# Patient Record
Sex: Male | Born: 1970 | Race: White | Hispanic: No | Marital: Single | Smoking: Current every day smoker
Health system: Southern US, Community
[De-identification: ages and names within clinical notes are randomized; demographics above are authoritative.]

## PROBLEM LIST (undated history)

## (undated) DIAGNOSIS — F319 Bipolar disorder, unspecified: Secondary | ICD-10-CM

## (undated) DIAGNOSIS — F209 Schizophrenia, unspecified: Secondary | ICD-10-CM

## (undated) DIAGNOSIS — R569 Unspecified convulsions: Secondary | ICD-10-CM

## (undated) DIAGNOSIS — I10 Essential (primary) hypertension: Secondary | ICD-10-CM

## (undated) DIAGNOSIS — E119 Type 2 diabetes mellitus without complications: Secondary | ICD-10-CM

## (undated) HISTORY — PX: APPENDECTOMY: SHX54

---

## 2010-08-22 ENCOUNTER — Emergency Department: Payer: Self-pay | Admitting: Emergency Medicine

## 2010-10-17 ENCOUNTER — Ambulatory Visit: Payer: Self-pay | Admitting: Internal Medicine

## 2010-11-07 ENCOUNTER — Inpatient Hospital Stay: Payer: Self-pay | Admitting: Psychiatry

## 2010-11-21 ENCOUNTER — Emergency Department: Payer: Self-pay | Admitting: Emergency Medicine

## 2011-02-27 ENCOUNTER — Emergency Department: Payer: Self-pay | Admitting: Unknown Physician Specialty

## 2011-04-26 ENCOUNTER — Emergency Department: Payer: Self-pay | Admitting: Emergency Medicine

## 2013-12-11 ENCOUNTER — Emergency Department: Payer: Self-pay | Admitting: Emergency Medicine

## 2013-12-11 LAB — ETHANOL: Ethanol %: <0.003 % (ref 0.000–0.080)

## 2013-12-11 LAB — URINALYSIS, COMPLETE
BLOOD: NEGATIVE
Bacteria: NONE SEEN
Bilirubin,UR: NEGATIVE
Glucose,UR: NEGATIVE mg/dL (ref 0–75)
Ketone: NEGATIVE
Leukocyte Esterase: NEGATIVE
NITRITE: NEGATIVE
PROTEIN: NEGATIVE
Ph: 7 (ref 4.5–8.0)
RBC,UR: 1 /HPF (ref 0–5)
Specific Gravity: 1.011 (ref 1.003–1.030)
Squamous Epithelial: NONE SEEN
WBC UR: NONE SEEN /HPF (ref 0–5)

## 2013-12-11 LAB — COMPREHENSIVE METABOLIC PANEL
ALT: 18 U/L (ref 12–78)
ANION GAP: 6 — AB (ref 7–16)
Albumin: 3.4 g/dL (ref 3.4–5.0)
Alkaline Phosphatase: 30 U/L — ABNORMAL LOW
BILIRUBIN TOTAL: 0.2 mg/dL (ref 0.2–1.0)
BUN: 9 mg/dL (ref 7–18)
Calcium, Total: 8.8 mg/dL (ref 8.5–10.1)
Chloride: 99 mmol/L (ref 98–107)
Co2: 28 mmol/L (ref 21–32)
Creatinine: 0.82 mg/dL (ref 0.60–1.30)
EGFR (Non-African Amer.): >60
Glucose: 108 mg/dL — ABNORMAL HIGH (ref 65–99)
OSMOLALITY: 266 (ref 275–301)
Potassium: 3.8 mmol/L (ref 3.5–5.1)
SGOT(AST): 18 U/L (ref 15–37)
SODIUM: 133 mmol/L — AB (ref 136–145)
Total Protein: 6.8 g/dL (ref 6.4–8.2)

## 2013-12-11 LAB — DRUG SCREEN, URINE

## 2013-12-11 LAB — TSH: THYROID STIMULATING HORM: 1.67 u[IU]/mL

## 2013-12-11 LAB — CBC
HCT: 37.3 % — AB (ref 40.0–52.0)
HGB: 13.2 g/dL (ref 13.0–18.0)
MCH: 33.1 pg (ref 26.0–34.0)
MCHC: 35.5 g/dL (ref 32.0–36.0)
MCV: 93 fL (ref 80–100)
Platelet: 153 10*3/uL (ref 150–440)
RBC: 3.99 10*6/uL — ABNORMAL LOW (ref 4.40–5.90)
RDW: 13.4 % (ref 11.5–14.5)
WBC: 6 10*3/uL (ref 3.8–10.6)

## 2013-12-11 LAB — SALICYLATE LEVEL

## 2013-12-11 LAB — ACETAMINOPHEN LEVEL: Acetaminophen: <2 ug/mL

## 2014-03-29 ENCOUNTER — Emergency Department (HOSPITAL_COMMUNITY): Payer: Medicaid Other

## 2014-03-29 ENCOUNTER — Encounter (HOSPITAL_COMMUNITY): Payer: Self-pay | Admitting: Emergency Medicine

## 2014-03-29 ENCOUNTER — Emergency Department (HOSPITAL_COMMUNITY)
Admission: EM | Admit: 2014-03-29 | Discharge: 2014-03-29 | Disposition: A | Discharge status: Home / Self Care | Payer: Medicaid Other | Attending: Emergency Medicine | Admitting: Emergency Medicine

## 2014-03-29 DIAGNOSIS — R079 Chest pain, unspecified: Secondary | ICD-10-CM

## 2014-03-29 DIAGNOSIS — R072 Precordial pain: Secondary | ICD-10-CM | POA: Insufficient documentation

## 2014-03-29 DIAGNOSIS — R109 Unspecified abdominal pain: Secondary | ICD-10-CM | POA: Insufficient documentation

## 2014-03-29 DIAGNOSIS — Z8669 Personal history of other diseases of the nervous system and sense organs: Secondary | ICD-10-CM | POA: Insufficient documentation

## 2014-03-29 DIAGNOSIS — F17200 Nicotine dependence, unspecified, uncomplicated: Secondary | ICD-10-CM | POA: Insufficient documentation

## 2014-03-29 DIAGNOSIS — E119 Type 2 diabetes mellitus without complications: Secondary | ICD-10-CM | POA: Insufficient documentation

## 2014-03-29 DIAGNOSIS — I10 Essential (primary) hypertension: Secondary | ICD-10-CM | POA: Insufficient documentation

## 2014-03-29 HISTORY — DX: Unspecified convulsions: R56.9

## 2014-03-29 HISTORY — DX: Type 2 diabetes mellitus without complications: E11.9

## 2014-03-29 HISTORY — DX: Essential (primary) hypertension: I10

## 2014-03-29 LAB — CBC WITH DIFFERENTIAL/PLATELET
BASOS ABS: 0 10*3/uL (ref 0.0–0.1)
BASOS PCT: 0 % (ref 0–1)
EOS ABS: 0.1 10*3/uL (ref 0.0–0.7)
EOS PCT: 2 % (ref 0–5)
HCT: 39.3 % (ref 39.0–52.0)
Hemoglobin: 13.9 g/dL (ref 13.0–17.0)
Lymphocytes Relative: 46 % (ref 12–46)
Lymphs Abs: 2.2 10*3/uL (ref 0.7–4.0)
MCH: 32.3 pg (ref 26.0–34.0)
MCHC: 35.4 g/dL (ref 30.0–36.0)
MCV: 91.4 fL (ref 78.0–100.0)
Monocytes Absolute: 0.5 10*3/uL (ref 0.1–1.0)
Monocytes Relative: 10 % (ref 3–12)
Neutro Abs: 2.1 10*3/uL (ref 1.7–7.7)
Neutrophils Relative %: 42 % — ABNORMAL LOW (ref 43–77)
PLATELETS: 187 10*3/uL (ref 150–400)
RBC: 4.3 MIL/uL (ref 4.22–5.81)
RDW: 13.2 % (ref 11.5–15.5)
WBC: 4.9 10*3/uL (ref 4.0–10.5)

## 2014-03-29 LAB — BASIC METABOLIC PANEL
BUN: 12 mg/dL (ref 6–23)
CALCIUM: 9.2 mg/dL (ref 8.4–10.5)
CO2: 29 mEq/L (ref 19–32)
Chloride: 98 mEq/L (ref 96–112)
Creatinine, Ser: 0.89 mg/dL (ref 0.50–1.35)
GFR calc Af Amer: >90 mL/min (ref >90)
Glucose, Bld: 124 mg/dL — ABNORMAL HIGH (ref 70–99)
Potassium: 4 mEq/L (ref 3.7–5.3)
SODIUM: 138 meq/L (ref 137–147)

## 2014-03-29 LAB — TROPONIN I: Troponin I: <0.3 ng/mL (ref <0.30)

## 2014-03-29 MED ORDER — ONDANSETRON 8 MG PO TBDP
8.0000 mg | ORAL_TABLET | Freq: Once | ORAL | Status: AC
  Administered 2014-03-29: 8 mg via ORAL
  Filled 2014-03-29: qty 1

## 2014-03-29 MED ORDER — PROMETHAZINE HCL 25 MG PO TABS: 25.0000 mg | ORAL_TABLET | Freq: Four times a day (QID) | ORAL | Status: DC | PRN

## 2014-03-29 MED ORDER — GI COCKTAIL ~~LOC~~
30.0000 mL | Freq: Once | ORAL | Status: AC
  Administered 2014-03-29: 30 mL via ORAL
  Filled 2014-03-29: qty 30

## 2014-03-29 NOTE — ED Notes (Signed)
Pt called in all waiting areas, unable to locate

## 2014-03-29 NOTE — Discharge Instructions (Signed)
Tests here were normal. Medication for nausea. Followup your primary care Dr.

## 2014-03-29 NOTE — ED Notes (Signed)
Pt c/o mid sternal cp and n/v after eating a sandwich this afternoon.

## 2014-03-29 NOTE — ED Notes (Signed)
Spoke with caregiver at [facility] and informed them of pts discharged. She states she would call him a ride. Pt placed in wheelchair in the waiting room.

## 2014-03-29 NOTE — ED Provider Notes (Signed)
CSN: [id number]     Arrival date & time 03/29/14  1818 History   First MD Initiated Contact with Patient 03/29/14 1922     Chief Complaint  Patient presents with  . Chest Pain     (Consider location/radiation/quality/duration/timing/severity/associated sxs/prior Treatment) HPI.... patient complains of sharp sternal chest pain after eating  sandwich this afternoon. No dyspnea, diaphoresis, nausea. He feels better now. Severity is mild. No other symptoms. No radiation of pain.  Past Medical History  Diagnosis Date  . Hypertension   . Diabetes mellitus without complication   . Seizures    Past Surgical History  Procedure Laterality Date  . Appendectomy     No family history on file. History  Substance Use Topics  . Smoking status: Current Every Day Smoker  . Smokeless tobacco: Not on file  . Alcohol Use: No    Review of Systems  All other systems reviewed and are negative.     Allergies  Review of patient's allergies indicates no known allergies.  Home Medications   Prior to Admission medications   Medication Sig Start Date End Date Taking? Authorizing Provider  promethazine (PHENERGAN) 25 MG tablet Take 1 tablet (25 mg total) by mouth every 6 (six) hours as needed. 03/29/14   Donnetta HutchingBrian Jerricka Carvey, MD   BP 110/64  Pulse 59  Temp(Src) 97.5 F (36.4 C)  Resp 18  Ht 5' 10.5" (1.791 m)  Wt 200 lb (90.719 kg)  BMI 28.28 kg/m2  SpO2 99% Physical Exam  Nursing note and vitals reviewed. Constitutional: He is oriented to person, place, and time. He appears well-developed and well-nourished.  HENT:  Head: Normocephalic and atraumatic.  Eyes: Conjunctivae and EOM are normal. Pupils are equal, round, and reactive to light.  Neck: Normal range of motion. Neck supple.  Cardiovascular: Normal rate, regular rhythm and normal heart sounds.   Pulmonary/Chest: Effort normal and breath sounds normal.  Abdominal: Soft. Bowel sounds are normal.  Musculoskeletal: Normal range of motion.   Neurological: He is alert and oriented to person, place, and time.  Skin: Skin is warm and dry.  Psychiatric: He has a normal mood and affect. His behavior is normal.    ED Course  Procedures (including critical care time) Labs Review Labs Reviewed  CBC WITH DIFFERENTIAL - Abnormal; Notable for the following:    Neutrophils Relative % 42 (*)    All other components within normal limits  BASIC METABOLIC PANEL - Abnormal; Notable for the following:    Glucose, Bld 124 (*)    All other components within normal limits  TROPONIN I    Imaging Review Dg Chest 2 View  03/29/2014   CLINICAL DATA:  Chest pain after eating  EXAM: CHEST  2 VIEW  COMPARISON:  None.  FINDINGS: There is no focal parenchymal opacity, pleural effusion, or pneumothorax. The heart and mediastinal contours are unremarkable.  There is mild thoracic spine spondylosis.  IMPRESSION: No active cardiopulmonary disease.   Electronically Signed   By: Elige KoHetal  Patel   On: 03/29/2014 19:09     EKG Interpretation None      MDM   Final diagnoses:  Chest pain  Abdominal pain    Patient looks well. Screening EKG, hemoglobin, troponin, chest x-ray all normal. Discharge medications Phenergan 25 mg    Donnetta HutchingBrian Tanveer Brammer, MD 03/29/14 2120

## 2015-08-31 ENCOUNTER — Encounter: Payer: Self-pay | Admitting: Medical Oncology

## 2015-08-31 ENCOUNTER — Emergency Department
Admission: EM | Admit: 2015-08-31 | Discharge: 2015-09-01 | Disposition: A | Discharge status: Home / Self Care | Payer: Medicaid Other | Attending: Emergency Medicine | Admitting: Emergency Medicine

## 2015-08-31 DIAGNOSIS — Z720 Tobacco use: Secondary | ICD-10-CM | POA: Diagnosis not present

## 2015-08-31 DIAGNOSIS — F1310 Sedative, hypnotic or anxiolytic abuse, uncomplicated: Secondary | ICD-10-CM | POA: Insufficient documentation

## 2015-08-31 DIAGNOSIS — F919 Conduct disorder, unspecified: Secondary | ICD-10-CM | POA: Insufficient documentation

## 2015-08-31 DIAGNOSIS — Z008 Encounter for other general examination: Secondary | ICD-10-CM | POA: Diagnosis present

## 2015-08-31 DIAGNOSIS — I10 Essential (primary) hypertension: Secondary | ICD-10-CM | POA: Diagnosis not present

## 2015-08-31 DIAGNOSIS — F209 Schizophrenia, unspecified: Secondary | ICD-10-CM

## 2015-08-31 DIAGNOSIS — E119 Type 2 diabetes mellitus without complications: Secondary | ICD-10-CM | POA: Diagnosis not present

## 2015-08-31 DIAGNOSIS — F200 Paranoid schizophrenia: Secondary | ICD-10-CM | POA: Diagnosis not present

## 2015-08-31 DIAGNOSIS — R4689 Other symptoms and signs involving appearance and behavior: Secondary | ICD-10-CM

## 2015-08-31 DIAGNOSIS — G40909 Epilepsy, unspecified, not intractable, without status epilepticus: Secondary | ICD-10-CM

## 2015-08-31 LAB — CBC
HCT: 39.4 % — ABNORMAL LOW (ref 40.0–52.0)
Hemoglobin: 13.8 g/dL (ref 13.0–18.0)
MCH: 33.4 pg (ref 26.0–34.0)
MCHC: 35.1 g/dL (ref 32.0–36.0)
MCV: 95.2 fL (ref 80.0–100.0)
PLATELETS: 147 10*3/uL — AB (ref 150–440)
RBC: 4.14 MIL/uL — AB (ref 4.40–5.90)
RDW: 13.9 % (ref 11.5–14.5)
WBC: 3.4 10*3/uL — AB (ref 3.8–10.6)

## 2015-08-31 LAB — COMPREHENSIVE METABOLIC PANEL
ALT: 16 U/L — ABNORMAL LOW (ref 17–63)
AST: 25 U/L (ref 15–41)
Albumin: 3.8 g/dL (ref 3.5–5.0)
Alkaline Phosphatase: 31 U/L — ABNORMAL LOW (ref 38–126)
Anion gap: 8 (ref 5–15)
BUN: 12 mg/dL (ref 6–20)
CHLORIDE: 101 mmol/L (ref 101–111)
CO2: 27 mmol/L (ref 22–32)
Calcium: 9.5 mg/dL (ref 8.9–10.3)
Creatinine, Ser: 0.67 mg/dL (ref 0.61–1.24)
GFR calc Af Amer: >60 mL/min (ref >60)
GFR calc non Af Amer: >60 mL/min (ref >60)
Glucose, Bld: 108 mg/dL — ABNORMAL HIGH (ref 65–99)
POTASSIUM: 4.3 mmol/L (ref 3.5–5.1)
Sodium: 136 mmol/L (ref 135–145)
TOTAL PROTEIN: 7 g/dL (ref 6.5–8.1)
Total Bilirubin: 0.4 mg/dL (ref 0.3–1.2)

## 2015-08-31 LAB — ACETAMINOPHEN LEVEL

## 2015-08-31 LAB — ETHANOL: Alcohol, Ethyl (B): <5 mg/dL (ref <5)

## 2015-08-31 LAB — SALICYLATE LEVEL
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
## 2015-08-31 NOTE — ED Notes (Signed)
BEHAVIORAL HEALTH ROUNDING Patient sleeping: Yes.   Patient alert and oriented: not applicable Behavior appropriate: Yes.  ; If no, describe: Patient is sleeping Nutrition and fluids offered: No Toileting and hygiene offered: Yes  Sitter present: Behavioral tech rounding every 15 minutes for safety Law enforcement present: Yes  and [employer] 
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
## 2015-08-31 NOTE — ED Notes (Signed)
BEHAVIORAL HEALTH ROUNDING Patient sleeping: No. Patient alert and oriented: Patient is oriented to self  Behavior appropriate: Yes.  ; If no, describe: Tearful at times, but will stop easily. Nutrition and fluids offered: Yes  Toileting and hygiene offered: Yes  Sitter present: Behavioral tech rounding every 15 minutes for safety. Law enforcement present: Yes  and [employer], television/film set.

## 2015-08-31 NOTE — ED Notes (Signed)

## 2015-08-31 NOTE — ED Notes (Signed)
ED BHU PLACEMENT JUSTIFICATION Is the patient under IVC or is there intent for IVC: No. Is the patient medically cleared: No. Is there vacancy in the ED BHU: No. Is the population mix appropriate for patient: Yes.   Is the patient awaiting placement in inpatient or outpatient setting: No. Has the patient had a psychiatric consult: No. Survey of unit performed for contraband, proper placement and condition of furniture, tampering with fixtures in bathroom, shower, and each patient room: Yes.  ; Findings:  APPEARANCE/BEHAVIOR cooperative and tearful at times, but easily redirected. NEURO ASSESSMENT Orientation: time, place and person Hallucinations: Yes.  Auditory Hallucinations Speech: Rate:slow speech, a bit slurred at times. Gait: normal RESPIRATORY ASSESSMENT Normal expansion.  Clear to auscultation.  No rales, rhonchi, or wheezing. CARDIOVASCULAR ASSESSMENT regular rate and rhythm, S1, S2 normal, no murmur, click, rub or gallop GASTROINTESTINAL ASSESSMENT soft, nontender, BS WNL, no r/g EXTREMITIES normal strength, tone, and muscle mass PLAN OF CARE Provide calm/safe environment. Vital signs assessed twice daily. ED BHU Assessment once each 12-hour shift. Collaborate with intake RN daily or as condition indicates. Assure the ED provider has rounded once each shift. Provide and encourage hygiene. Provide redirection as needed. Assess for escalating behavior; address immediately and inform ED provider.  Assess family dynamic and appropriateness for visitation as needed: No.; If necessary, describe findings:  Educate the patient/family about BHU procedures/visitation: Yes.  ; If necessary, describe findings:

## 2015-08-31 NOTE — ED Notes (Signed)
Patient is sleeping on stretcher. NAD. 
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
## 2015-09-01 DIAGNOSIS — F209 Schizophrenia, unspecified: Secondary | ICD-10-CM

## 2015-09-01 DIAGNOSIS — G40909 Epilepsy, unspecified, not intractable, without status epilepticus: Secondary | ICD-10-CM

## 2015-09-01 DIAGNOSIS — I10 Essential (primary) hypertension: Secondary | ICD-10-CM

## 2015-09-01 DIAGNOSIS — F200 Paranoid schizophrenia: Secondary | ICD-10-CM | POA: Diagnosis not present

## 2015-09-01 LAB — URINE DRUG SCREEN, QUALITATIVE (ARMC ONLY)
Amphetamines, Ur Screen: NOT DETECTED
BARBITURATES, UR SCREEN: NOT DETECTED
Benzodiazepine, Ur Scrn: POSITIVE — AB
Cannabinoid 50 Ng, Ur ~~LOC~~: NOT DETECTED
Cocaine Metabolite,Ur ~~LOC~~: NOT DETECTED
MDMA (ECSTASY) UR SCREEN: NOT DETECTED
METHADONE SCREEN, URINE: NOT DETECTED
OPIATE, UR SCREEN: NOT DETECTED
Phencyclidine (PCP) Ur S: NOT DETECTED
Tricyclic, Ur Screen: NOT DETECTED

## 2015-09-01 MED ORDER — CLONAZEPAM 0.5 MG PO TABS: 0.5000 mg | ORAL_TABLET | Freq: Every day | ORAL | Status: DC | PRN

## 2015-09-01 MED ORDER — LACTULOSE 10 GM/15ML PO SOLN
10.0000 g | Freq: Once | ORAL | Status: AC
  Administered 2015-09-01: 10 g via ORAL
  Filled 2015-09-01: qty 15

## 2015-09-01 MED ORDER — PROPRANOLOL HCL 20 MG PO TABS
20.0000 mg | ORAL_TABLET | Freq: Three times a day (TID) | ORAL | Status: DC
  Filled 2015-09-01: qty 1

## 2015-09-01 MED ORDER — METFORMIN HCL 500 MG PO TABS
500.0000 mg | ORAL_TABLET | Freq: Two times a day (BID) | ORAL | Status: DC
  Administered 2015-09-01: 500 mg via ORAL
  Filled 2015-09-01: qty 1

## 2015-09-01 MED ORDER — PANTOPRAZOLE SODIUM 40 MG PO TBEC
40.0000 mg | DELAYED_RELEASE_TABLET | Freq: Two times a day (BID) | ORAL | Status: DC
  Administered 2015-09-01: 40 mg via ORAL
  Filled 2015-09-01: qty 1

## 2015-09-01 MED ORDER — METHOCARBAMOL 500 MG PO TABS
500.0000 mg | ORAL_TABLET | Freq: Three times a day (TID) | ORAL | Status: DC
  Filled 2015-09-01: qty 1

## 2015-09-01 MED ORDER — CITALOPRAM HYDROBROMIDE 20 MG PO TABS
40.0000 mg | ORAL_TABLET | Freq: Every day | ORAL | Status: DC
  Administered 2015-09-01: 40 mg via ORAL
  Filled 2015-09-01: qty 2

## 2015-09-01 MED ORDER — PRAZOSIN HCL 1 MG PO CAPS
1.0000 mg | ORAL_CAPSULE | Freq: Two times a day (BID) | ORAL | Status: DC
  Filled 2015-09-01: qty 1

## 2015-09-01 MED ORDER — CLONAZEPAM 0.5 MG PO TABS
0.5000 mg | ORAL_TABLET | Freq: Three times a day (TID) | ORAL | Status: DC
  Administered 2015-09-01: 0.5 mg via ORAL
  Filled 2015-09-01: qty 1

## 2015-09-01 MED ORDER — PALIPERIDONE ER 6 MG PO TB24
12.0000 mg | ORAL_TABLET | Freq: Every day | ORAL | Status: DC
  Filled 2015-09-01: qty 2

## 2015-09-01 MED ORDER — BENZTROPINE MESYLATE 1 MG PO TABS
1.0000 mg | ORAL_TABLET | Freq: Two times a day (BID) | ORAL | Status: DC
  Administered 2015-09-01: 1 mg via ORAL
  Filled 2015-09-01: qty 1

## 2015-09-01 MED ORDER — DONEPEZIL HCL 5 MG PO TABS
5.0000 mg | ORAL_TABLET | Freq: Every day | ORAL | Status: DC
  Filled 2015-09-01: qty 1

## 2015-09-01 MED ORDER — GABAPENTIN 300 MG PO CAPS
300.0000 mg | ORAL_CAPSULE | Freq: Two times a day (BID) | ORAL | Status: DC
  Administered 2015-09-01: 300 mg via ORAL
  Filled 2015-09-01: qty 1

## 2015-09-01 MED ORDER — ACETAMINOPHEN 325 MG PO TABS: 650.0000 mg | ORAL_TABLET | Freq: Four times a day (QID) | ORAL | Status: DC | PRN

## 2015-09-01 MED ORDER — DIVALPROEX SODIUM 500 MG PO DR TAB: 500.0000 mg | DELAYED_RELEASE_TABLET | Freq: Three times a day (TID) | ORAL | Status: DC

## 2015-09-01 NOTE — BHH Counselor (Addendum)
Per Psych MD, Dr. Toni Amend, patient has been psychiatrically cleared. Writer called Group Home,[facility] (Patricia--[phone]). They will pick him up when discharge is completed. Patient will follow up with his current outpatient provider, Salomon Fick (Nurse Practitioner), w/[facility].

## 2015-09-01 NOTE — ED Notes (Signed)
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
## 2015-09-01 NOTE — ED Notes (Signed)
Patient observed lying in bed with eyes closed  Even, unlabored respirations observed   NAD pt appears to be sleeping  I will continue to monitor along with every 15 minute visual observations and ongoing security camera monitoring    

## 2015-09-01 NOTE — ED Notes (Signed)
Client is resting quietly; no complaints voiced; will continue to monitor. 

## 2015-09-01 NOTE — ED Notes (Signed)
Spoke with Derrill Kay, MD regarding patient's home medications. MD with [facility] to start patient on all of his home medications as they are listed on his MAR from the group home. Orders to be entered by this RN.

## 2015-09-01 NOTE — ED Notes (Signed)
BEHAVIORAL HEALTH ROUNDING Patient sleeping: Yes.   Patient alert and oriented: no Behavior appropriate: Yes.  ; If no, describe:  Nutrition and fluids offered: No Toileting and hygiene offered: No Sitter present: not applicable Law enforcement present: Yes  

## 2015-09-01 NOTE — ED Notes (Signed)
ED BHU PLACEMENT JUSTIFICATION Is the patient under IVC or is there intent for IVC: Yes.   Is the patient medically cleared: Yes.   Is there vacancy in the ED BHU: Yes.   Is the population mix appropriate for patient: Yes.   Is the patient awaiting placement in inpatient or outpatient setting: ? Return to group home Has the patient had a psychiatric consult:  Consult pending. Survey of unit performed for contraband, proper placement and condition of furniture, tampering with fixtures in bathroom, shower, and each patient room: Yes.  ; Findings:  APPEARANCE/BEHAVIOR Calm and cooperative NEURO ASSESSMENT Orientation: oriented x3  Denies pain Hallucinations: No.None noted (Hallucinations) Speech: Normal Gait: normal RESPIRATORY ASSESSMENT even unlabored respirations  CARDIOVASCULAR ASSESSMENT Pulses equal  regular rate  Skin warm and dry GASTROINTESTINAL ASSESSMENT no GI complaint EXTREMITIES Full ROM PLAN OF CARE Provide calm/safe environment. Vital signs assessed twice daily. ED BHU Assessment once each 12-hour shift. Collaborate with intake RN daily or as condition indicates. Assure the ED provider has rounded once each shift. Provide and encourage hygiene. Provide redirection as needed. Assess for escalating behavior; address immediately and inform ED provider.  Assess family dynamic and appropriateness for visitation as needed: Yes.  ; If necessary, describe findings:  Educate the patient/family about BHU procedures/visitation: Yes.  ; If necessary, describe findings:

## 2015-09-01 NOTE — ED Notes (Signed)
BEHAVIORAL HEALTH ROUNDING Patient sleeping: No. Patient alert and oriented: yes Behavior appropriate: Yes.  ; If no, describe:  Nutrition and fluids offered: yes Toileting and hygiene offered: Yes  Sitter present: q15 minute observations and security camera monitoring Law enforcement present: Yes  [employer]  

## 2015-09-01 NOTE — ED Notes (Signed)
Supper provided along with an extra drink   I called the group home back and gave an update  - he is to be discharged back this afternoon - Jeremy Schultz reports that someone is already on there way to pick him up    Pt observed with no unusual behavior  Appropriate to stimulation  No verbalized needs or concerns at this time  NAD assessed  Continue to monitor

## 2015-09-01 NOTE — ED Notes (Signed)
Report received from Andrea RN. Patient care assumed. Patient/RN introduction complete. Will continue to monitor.  

## 2015-09-01 NOTE — ED Notes (Signed)
Lunch provided along with an extra drink   Appropriate to stimulation  No verbalized needs or concerns at this time  NAD assessed  Continue to monitor 

## 2015-09-01 NOTE — ED Notes (Signed)
TTS in progress 

## 2015-09-01 NOTE — ED Notes (Signed)
Patient requesting pm medications at this time. Will speak with MD regarding home medications.

## 2015-09-01 NOTE — ED Notes (Signed)

## 2015-09-01 NOTE — ED Notes (Signed)
Report handed off to Amy T., RN 

## 2015-09-01 NOTE — ED Notes (Signed)

## 2015-09-01 NOTE — Consult Note (Signed)
[facility] Consult   Reason for Consult:  Consult for this 44 year old gentleman with a history of schizophrenia who had himself brought to the emergency room yesterday. Referring Physician:  Clearnce Hasten Patient Identification: Jeremy Schultz MRN:  [id number] Principal Diagnosis: Schizophrenia Diagnosis:   Patient Active Problem List   Diagnosis Date Noted  . Schizophrenia [F20.9] 09/01/2015  . High blood pressure [I10] 09/01/2015  . Seizure disorder [G40.909] 09/01/2015    Total Time spent with patient: 1 hour  Subjective:   Jeremy Schultz is a 44 y.o. male patient admitted with "I think something was wrong with my medicine and it made me go berzerk,".  HPI:  Information from the patient and the chart. Patient says that yesterday he was feeling upset at his group home. He is not very good at describing it but says that he has been having visual hallucinations. His mood is been a little bit more irritable. He doesn't sleep all that well at night. At the same time he denies having any suicidal or homicidal ideation. He says that his hallucinations have been going on for years and are probably about the same as usual. He initially told me that something was wrong with his medicine but when I ask him to describe what he thought was wrong he could not really name symptoms so much as just to tell me that he takes too many pills. Yesterday when he came in he was saying that he wanted to go to a new group home where people would care for him better. I asked him several times today whether he was having any problems at the group home or any dissatisfaction. The most he could come up with his disabled but sometimes he gets stressed out but he said he didn't think that they were doing a particularly bad job. He tells me today he is comfortable going back to his group home.  Past psychiatric history: We don't have any old psychiatric records here. [place] He reports previous hospitalizations at [facility] and at a hospital in [place]. He's been living at his current group home he estimates for about 3 years. He has a diagnosis of schizophrenia. He is not very clear about naming medications but is taking anti-psychotics and mood stabilizers currently.He says that many years ago he used to be aggressive and violent but hasn't been like that in a long time. He says he did have 1 suicide attempt when he was about 44 years old with an attempt to cut his arm off. Evidently he didn't succeed. He says he's never tried to kill himself again since then.  Medical history: Reportedly has a history of seizure disorder. He doesn't describe it very well. High blood pressure.  Social history: Currently resides at a group home locally. He says he doesn't have any family in them immediate area.  Family history: He knows of no family history of mental illness.  Substance abuse history: Denies that he drinks alcohol or abuses any drugs and denies any past alcohol or drug abuse.  Current medication: Includes Cogentin, Celexa, clonazepam, Depakote, Aricept, gabapentin, Glucophage, Robaxin, and in [place], Minipress, Inderal, Protonix  Past Psychiatric History: Long history of mental illness going back to childhood. One previous suicide attempt at age 62. Hasn't been hospitalized in years. Diagnosis of schizophrenia.  Risk to Self: Suicidal Ideation: No (denies) Suicidal Intent: No (denies) Is patient at risk for suicide?: No  Suicidal Plan?: No (denies) Access to Means: No (denies) What has been your use of drugs/alcohol within the last 12 months?: none in recent past How many times?: 1 Other Self Harm Risks: none noted Triggers for Past Attempts: Unpredictable Intentional Self Injurious Behavior: None Risk to Others: Homicidal Ideation: No (denies) Thoughts of Harm to Others: No (denies) Current Homicidal Intent: No  (denies) Current Homicidal Plan: No (denies) Access to Homicidal Means: No (denies) Identified Victim: na History of harm to others?: Yes (no details given) Assessment of Violence: In distant past Violent Behavior Description: no details available Does patient have access to weapons?: No Criminal Charges Pending?: No Does patient have a court date: No Prior Inpatient Therapy: Prior Inpatient Therapy: Yes Prior Therapy Dates: pt could not remember Prior Therapy Facilty/Provider(s): [facility] Reason for Treatment: pt could not remember Prior Outpatient Therapy: Prior Outpatient Therapy: No Prior Therapy Dates: na Prior Therapy Facilty/Provider(s): na Reason for Treatment: na Does patient have an ACCT team?: Unknown Does patient have Intensive In-House Services?  : No Does patient have [facility] services? : Unknown Does patient have P4CC services?: Unknown  Past Medical History:  Past Medical History  Diagnosis Date  . Hypertension   . Diabetes mellitus without complication   . Seizures     Past Surgical History  Procedure Laterality Date  . Appendectomy     Family History: No family history on file. Family Psychiatric  History: He says he does not know of any family history of mental illness or substance abuse Social History:  History  Alcohol Use No     History  Drug Use No    Social History   Social History  . Marital Status: Single    Spouse Name: N/A  . Number of Children: N/A  . Years of Education: N/A   Social History Main Topics  . Smoking status: Current Every Day Smoker  . Smokeless tobacco: None  . Alcohol Use: No  . Drug Use: No  . Sexual Activity: Not Asked   Other Topics Concern  . None   Social History Narrative   Additional Social History:    Prescriptions: See PTA list History of alcohol / drug use?: No history of alcohol / drug abuse (pt sts he does not use substances)                     Allergies:  No Known Allergies  Labs:   Results for orders placed or performed during the hospital encounter of 08/31/15 (from the past 48 hour(s))  Comprehensive metabolic panel     Status: Abnormal   Collection Time: 08/31/15  6:04 PM  Result Value Ref Range   Sodium 136 135 - 145 mmol/L   Potassium 4.3 3.5 - 5.1 mmol/L   Chloride 101 101 - 111 mmol/L   CO2 27 22 - 32 mmol/L   Glucose, Bld 108 (H) 65 - 99 mg/dL   BUN 12 6 - 20 mg/dL   Creatinine, Ser 0.67 0.61 - 1.24 mg/dL   Calcium 9.5 8.9 - 10.3 mg/dL   Total Protein 7.0 6.5 - 8.1 g/dL   Albumin 3.8 3.5 - 5.0 g/dL   AST 25 15 - 41 U/L   ALT 16 (L) 17 - 63 U/L   Alkaline Phosphatase 31 (L) 38 - 126 U/L   Total Bilirubin 0.4 0.3 - 1.2 mg/dL   GFR calc non Af Amer >60 >60 mL/min   GFR calc Af Amer >60 >60 mL/min  Comment: (NOTE) The eGFR has been calculated using the CKD EPI equation. This calculation has not been validated in all clinical situations. eGFR's persistently <60 mL/min signify possible Chronic Kidney Disease.    Anion gap 8 5 - 15  Ethanol (ETOH)     Status: None   Collection Time: 08/31/15  6:04 PM  Result Value Ref Range   Alcohol, Ethyl (B) <5 <5 mg/dL    Comment:        LOWEST DETECTABLE LIMIT FOR SERUM ALCOHOL IS 5 mg/dL FOR MEDICAL PURPOSES ONLY   Salicylate level     Status: None   Collection Time: 08/31/15  6:04 PM  Result Value Ref Range   Salicylate Lvl <0.4 2.8 - 30.0 mg/dL  Acetaminophen level     Status: Abnormal   Collection Time: 08/31/15  6:04 PM  Result Value Ref Range   Acetaminophen (Tylenol), Serum <10 (L) 10 - 30 ug/mL    Comment:        THERAPEUTIC CONCENTRATIONS VARY SIGNIFICANTLY. A RANGE OF 10-30 ug/mL MAY BE AN EFFECTIVE CONCENTRATION FOR MANY PATIENTS. HOWEVER, SOME ARE BEST TREATED AT CONCENTRATIONS OUTSIDE THIS RANGE. ACETAMINOPHEN CONCENTRATIONS >150 ug/mL AT 4 HOURS AFTER INGESTION AND >50 ug/mL AT 12 HOURS AFTER INGESTION ARE OFTEN ASSOCIATED WITH TOXIC REACTIONS.   CBC     Status: Abnormal    Collection Time: 08/31/15  6:04 PM  Result Value Ref Range   WBC 3.4 (L) 3.8 - 10.6 K/uL   RBC 4.14 (L) 4.40 - 5.90 MIL/uL   Hemoglobin 13.8 13.0 - 18.0 g/dL   HCT 39.4 (L) 40.0 - 52.0 %   MCV 95.2 80.0 - 100.0 fL   MCH 33.4 26.0 - 34.0 pg   MCHC 35.1 32.0 - 36.0 g/dL   RDW 13.9 11.5 - 14.5 %   Platelets 147 (L) 150 - 440 K/uL  Urine Drug Screen, Qualitative ([facility] only)     Status: Abnormal   Collection Time: 08/31/15 11:56 PM  Result Value Ref Range   Tricyclic, Ur Screen NONE DETECTED NONE DETECTED   Amphetamines, Ur Screen NONE DETECTED NONE DETECTED   MDMA (Ecstasy)Ur Screen NONE DETECTED NONE DETECTED   Cocaine Metabolite,Ur [place] NONE DETECTED NONE DETECTED   Opiate, Ur Screen NONE DETECTED NONE DETECTED   Phencyclidine (PCP) Ur S NONE DETECTED NONE DETECTED   Cannabinoid 50 Ng, Ur [place] NONE DETECTED NONE DETECTED   Barbiturates, Ur Screen NONE DETECTED NONE DETECTED   Benzodiazepine, Ur Scrn POSITIVE (A) NONE DETECTED   Methadone Scn, Ur NONE DETECTED NONE DETECTED    Comment: (NOTE) 540  Tricyclics, urine               Cutoff 1000 ng/mL 200  Amphetamines, urine             Cutoff 1000 ng/mL 300  MDMA (Ecstasy), urine           Cutoff 500 ng/mL 400  Cocaine Metabolite, urine       Cutoff 300 ng/mL 500  Opiate, urine                   Cutoff 300 ng/mL 600  Phencyclidine (PCP), urine      Cutoff 25 ng/mL 700  Cannabinoid, urine              Cutoff 50 ng/mL 800  Barbiturates, urine             Cutoff 200 ng/mL 900  Benzodiazepine, urine  Cutoff 200 ng/mL 1000 Methadone, urine                Cutoff 300 ng/mL 1100 1200 The urine drug screen provides only a preliminary, unconfirmed 1300 analytical test result and should not be used for non-medical 1400 purposes. Clinical consideration and professional judgment should 1500 be applied to any positive drug screen result due to possible 1600 interfering substances. A more specific alternate chemical method 1700 must be used  in order to obtain a confirmed analytical result.  1800 Gas chromato graphy / mass spectrometry (GC/MS) is the preferred 1900 confirmatory method.     Current Facility-Administered Medications  Medication Dose Route Frequency Provider Last Rate Last Dose  . acetaminophen (TYLENOL) tablet 650 mg  650 mg Oral Q6H PRN Nance Pear, MD      . benztropine (COGENTIN) tablet 1 mg  1 mg Oral BID Nance Pear, MD   1 mg at 09/01/15 0932  . citalopram (CELEXA) tablet 40 mg  40 mg Oral Daily Nance Pear, MD   40 mg at 09/01/15 0931  . clonazePAM (KLONOPIN) tablet 0.5 mg  0.5 mg Oral TID Nance Pear, MD   0.5 mg at 09/01/15 0932  . clonazePAM (KLONOPIN) tablet 0.5 mg  0.5 mg Oral Daily PRN Nance Pear, MD      . Derrill Memo ON 09/02/2015] divalproex (DEPAKOTE) DR tablet 500 mg  500 mg Oral TID Nance Pear, MD      . donepezil (ARICEPT) tablet 5 mg  5 mg Oral QHS Nance Pear, MD      . gabapentin (NEURONTIN) capsule 300 mg  300 mg Oral BID Nance Pear, MD   300 mg at 09/01/15 0930  . metFORMIN (GLUCOPHAGE) tablet 500 mg  500 mg Oral BID WC Nance Pear, MD   500 mg at 09/01/15 0929  . [START ON 09/02/2015] methocarbamol (ROBAXIN) tablet 500 mg  500 mg Oral TID Nance Pear, MD      . Derrill Memo ON 09/02/2015] paliperidone (INVEGA) 24 hr tablet 12 mg  12 mg Oral Daily Nance Pear, MD      . pantoprazole (PROTONIX) EC tablet 40 mg  40 mg Oral BID Nance Pear, MD   40 mg at 09/01/15 0930  . [START ON 09/02/2015] prazosin (MINIPRESS) capsule 1 mg  1 mg Oral BID Nance Pear, MD      . Derrill Memo ON 09/02/2015] propranolol (INDERAL) tablet 20 mg  20 mg Oral TID Nance Pear, MD       Current Outpatient Prescriptions  Medication Sig Dispense Refill  . acetaminophen (TYLENOL) 325 MG tablet Take 650 mg by mouth every 6 (six) hours as needed for moderate pain.    . benztropine (COGENTIN) 1 MG tablet Take 1 mg by mouth 2 (two) times daily.    . citalopram (CELEXA) 40 MG tablet  Take 40 mg by mouth daily.    . clonazePAM (KLONOPIN) 0.5 MG tablet Take 0.5 mg by mouth 3 (three) times daily.    . clonazePAM (KLONOPIN) 0.5 MG tablet Take 0.5 mg by mouth daily as needed for anxiety.    . divalproex (DEPAKOTE ER) 500 MG 24 hr tablet Take 500 mg by mouth 3 (three) times daily.    Marland Kitchen donepezil (ARICEPT) 5 MG tablet Take 5 mg by mouth at bedtime.    . gabapentin (NEURONTIN) 300 MG capsule Take 300 mg by mouth 2 (two) times daily.    Marland Kitchen lactulose (CHRONULAC) 10 GM/15ML solution Take 10 g by mouth daily.    Marland Kitchen  metFORMIN (GLUCOPHAGE) 500 MG tablet Take 500 mg by mouth 2 (two) times daily with a meal.    . methocarbamol (ROBAXIN) 500 MG tablet Take 500 mg by mouth 3 (three) times daily.    Marland Kitchen omeprazole (PRILOSEC) 20 MG capsule Take 40 mg by mouth 2 (two) times daily.    . paliperidone (INVEGA) 6 MG 24 hr tablet Take 12 mg by mouth daily.    . prazosin (MINIPRESS) 1 MG capsule Take 1 mg by mouth 2 (two) times daily.    . propranolol (INDERAL) 20 MG tablet Take 20 mg by mouth 3 (three) times daily.    . Skin Protectants, Misc. (EUCERIN) cream Apply 1 application topically 2 (two) times daily.    . sodium chloride (OCEAN) 0.65 % SOLN nasal spray Place 2 sprays into both nostrils 3 (three) times daily as needed for congestion.    . promethazine (PHENERGAN) 25 MG tablet Take 1 tablet (25 mg total) by mouth every 6 (six) hours as needed. (Patient not taking: Reported on 09/01/2015) 15 tablet 0    Musculoskeletal: Strength & Muscle Tone: decreased Gait & Station: shuffle Patient leans: Front  Psychiatric Specialty Exam: Review of Systems  Constitutional: Negative.   HENT: Negative.   Eyes: Negative.   Respiratory: Negative.   Cardiovascular: Negative.   Gastrointestinal: Negative.   Musculoskeletal: Negative.   Skin: Negative.   Neurological: Negative.   Psychiatric/Behavioral: Positive for hallucinations. Negative for depression, suicidal ideas, memory loss and substance abuse.  The patient is not nervous/anxious and does not have insomnia.     Blood pressure 102/72, pulse 63, temperature 97.7 F (36.5 C), temperature source Oral, resp. rate 18, height _0  (1.778 m), weight 90.719 kg (200 lb), SpO2 100 %.Body mass index is 28.7 kg/(m^2).  General Appearance: Disheveled  Eye Contact::  Minimal  Speech:  Slow  Volume:  Decreased  Mood:  Euthymic  Affect:  Flat  Thought Process:  Linear  Orientation:  Full (Time, Place, and Person)  Thought Content:  Hallucinations: Visual  Suicidal Thoughts:  No  Homicidal Thoughts:  No  Memory:  Immediate;   Good Recent;   Fair Remote;   Fair  Judgement:  Intact  Insight:  Fair  Psychomotor Activity:  Normal  Concentration:  Fair  Recall:  [employer] of Knowledge:Fair  Language: Fair  Akathisia:  No  Handed:  Right  AIMS (if indicated):     Assets:  Communication Skills Desire for Improvement Financial Resources/Insurance Housing Social Support  ADL's:  Intact  Cognition: Impaired,  Mild  Sleep:      Treatment Plan Summary: Medication management and Plan Patient has not shown any dangerous or aggressive behavior since being in the emergency room. He has denied any suicidal thoughts. Patient today continues to deny any suicidal or homicidal ideation. Reports that he feels comfortable going back to his group home. He is comfortable staying on his current medicine. He gets outpatient medication management in the community and will be referred back to his current provider. Case will be reviewed with emergency room physician and he can be released from paperwork and discharged home. Supportive counseling completed. Vital signs stable. No sign of any seizure activity.  Disposition: No evidence of imminent risk to self or others at present.   Patient does not meet criteria for psychiatric inpatient admission. Supportive therapy provided about ongoing stressors.  Tayquan Gassman 09/01/2015 3:52 PM

## 2015-09-01 NOTE — ED Notes (Signed)
Breakfast provided  Pt observed with no unusual behavior  Appropriate to stimulation  No verbalized needs or concerns at this time  NAD assessed  Continue to monitor 

## 2015-09-01 NOTE — ED Notes (Signed)
BEHAVIORAL HEALTH ROUNDING Patient sleeping: Yes.   Patient alert and oriented: yes Behavior appropriate: Yes.  ; If no, describe:  Nutrition and fluids offered: Yes  Toileting and hygiene offered: Yes  Sitter present: no Law enforcement present: Yes  

## 2015-09-01 NOTE — ED Notes (Signed)
ED BHU PLACEMENT JUSTIFICATION Is the patient under IVC or is there intent for IVC: No. Is the patient medically cleared: Yes.   Is there vacancy in the ED BHU: Yes.   Is the population mix appropriate for patient: Yes.   Is the patient awaiting placement in inpatient or outpatient setting: No. Has the patient had a psychiatric consult: Yes.   Survey of unit performed for contraband, proper placement and condition of furniture, tampering with fixtures in bathroom, shower, and each patient room: Yes.  ;  APPEARANCE/BEHAVIOR calm, cooperative and adequate rapport can be established NEURO ASSESSMENT Orientation: time, place and person Hallucinations: Yes.  Auditory Hallucinations Speech: Garbled Gait: normal RESPIRATORY ASSESSMENT Normal expansion.  Clear to auscultation.  No rales, rhonchi, or wheezing. CARDIOVASCULAR ASSESSMENT regular rate and rhythm, S1, S2 normal, no murmur, click, rub or gallop GASTROINTESTINAL ASSESSMENT soft, nontender, BS WNL, no r/g EXTREMITIES normal strength, tone, and muscle mass PLAN OF CARE Provide calm/safe environment. Vital signs assessed twice daily. ED BHU Assessment once each 12-hour shift. Collaborate with intake RN daily or as condition indicates. Assure the ED provider has rounded once each shift. Provide and encourage hygiene. Provide redirection as needed. Assess for escalating behavior; address immediately and inform ED provider.  Assess family dynamic and appropriateness for visitation as needed: Yes.  ; If necessary, describe findings:  Educate the patient/family about BHU procedures/visitation: Yes.  ; If necessary, describe findings:

## 2015-09-01 NOTE — ED Provider Notes (Signed)
-----------------------------------------   7:00 AM on 09/01/2015 -----------------------------------------   Blood pressure 112/48, pulse 63, temperature 98.2 F (36.8 C), temperature source Oral, resp. rate 18, height  (1.778 m), weight 200 lb (90.719 kg), SpO2 96 %.  The patient had no acute events since last update.  Calm and cooperative at this time.  Disposition is pending per Psychiatry/Behavioral Medicine team recommendations.     Irean Hong, MD 09/01/15 0700

## 2015-09-01 NOTE — ED Notes (Signed)
Medications entered based on MAR from group home. Pharmacy tech back on unit at this time. Will have medication reconciliation completed by pharmacy to verify accuracy prior to any medications being administered.
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
## 2015-09-01 NOTE — ED Notes (Signed)
Am meds administered as ordered  Assessment completed  He denies pain  Plan of care discussed

## 2015-09-01 NOTE — ED Provider Notes (Signed)
-----------------------------------------   4:36 PM on 09/01/2015 -----------------------------------------  Patient has been seen and evaluated by psychiatry. They believe the patient is safe for discharge home. We will discharge the patient, [facility].  Minna Antis, MD 09/01/15 [phone]

## 2015-09-01 NOTE — ED Notes (Signed)
2/2 bags of belongings returned to him and he verbalized that he has received back all belongings that he came here with   Discharge instructions reviewed with him and group home staff and they both verbalized agreement and understanding

## 2015-09-01 NOTE — Discharge Instructions (Signed)
Schizophrenia °Schizophrenia is a mental illness. It may cause disturbed or disorganized thinking, speech, or behavior. People with schizophrenia have problems functioning in one or more areas of life: work, school, home, or relationships. People with schizophrenia are at increased risk for suicide, certain chronic physical illnesses, and unhealthy behaviors, such as smoking and drug use. °People who have family members with schizophrenia are at higher risk of developing the illness. Schizophrenia affects men and women equally but usually appears at an earlier age (teenage or early adult years) in men.  °SYMPTOMS °The earliest symptoms are often subtle (prodrome) and may go unnoticed until the illness becomes more severe (first-break psychosis). Symptoms of schizophrenia may be continuous or may come and go in severity. Episodes often are triggered by major life events, such as family stress, college, military service, marriage, pregnancy or child birth, divorce, or loss of a loved one. People with schizophrenia may see, hear, or feel things that do not exist (hallucinations). They may have false beliefs in spite of obvious proof to the contrary (delusions). Sometimes speech is incoherent or behavior is odd or withdrawn.  °DIAGNOSIS °Schizophrenia is diagnosed through an assessment by your caregiver. Your caregiver will ask questions about your thoughts, behavior, mood, and ability to function in daily life. Your caregiver may ask questions about your medical history and use of alcohol or drugs, including prescription medication. Your caregiver may also order blood tests and imaging exams. Certain medical conditions and substances can cause symptoms that resemble schizophrenia. Your caregiver may refer you to a mental health specialist for evaluation. There are three major criterion for a diagnosis of schizophrenia: °· Two or more of the following five symptoms are present for a month or longer: °¨ Delusions. Often  the delusions are that you are being attacked, harassed, cheated, persecuted or conspired against (persecutory delusions). °¨ Hallucinations.   °¨ Disorganized speech that does not make sense to others. °¨ Grossly disorganized (confused or unfocused) behavior or extremely overactive or underactive motor activity (catatonia). °¨ Negative symptoms such as bland or blunted emotions (flat affect), loss of will power (avolition), and withdrawal from social contacts (social isolation). °· Level of functioning in one or more major areas of life (work, school, relationships, or self-care) is markedly below the level of functioning before the onset of illness.   °· There are continuous signs of illness (either mild symptoms or decreased level of functioning) for at least 6 months or longer. °TREATMENT  °Schizophrenia is a long-term illness. It is best controlled with continuous treatment rather than treatment only when symptoms occur. The following treatments are used to manage schizophrenia: °· Medication--Medication is the most effective and important form of treatment for schizophrenia. Antipsychotic medications are usually prescribed to help manage schizophrenia. Other types of medication may be added to relieve any symptoms that may occur despite the use of antipsychotic medications. °· Counseling or talk therapy--Individual, group, or family counseling may be helpful in providing education, support, and guidance. Many people with schizophrenia also benefit from social skills and job skills (vocational) training. °A combination of medication and counseling is best for managing the disorder over time. A procedure in which electricity is applied to the brain through the scalp (electroconvulsive therapy) may be used to treat catatonic schizophrenia or schizophrenia in people who cannot take or do not respond to medication and counseling. °Document Released: 11/16/2000 Document Revised: 07/22/2013 Document Reviewed:  02/11/2013 °ExitCare® Patient Information ©2015 [employer]. This information is not intended to replace advice given to you by   your health care provider. Make sure you discuss any questions you have with your health care provider. ° °

## 2015-09-01 NOTE — ED Notes (Signed)
md Clapacs is in consulting with him at this time   Elease Hashimoto from [facility] has called  [phone] and I told her that I will call her back with an update after the consult is finished

## 2016-03-12 ENCOUNTER — Encounter: Payer: Self-pay | Admitting: *Deleted

## 2016-03-12 ENCOUNTER — Emergency Department
Admission: EM | Admit: 2016-03-12 | Discharge: 2016-03-13 | Disposition: A | Discharge status: Other Acute Inpatient Hospital | Payer: Medicaid Other | Attending: Emergency Medicine | Admitting: Emergency Medicine

## 2016-03-12 DIAGNOSIS — Z79899 Other long term (current) drug therapy: Secondary | ICD-10-CM | POA: Diagnosis not present

## 2016-03-12 DIAGNOSIS — I10 Essential (primary) hypertension: Secondary | ICD-10-CM | POA: Diagnosis not present

## 2016-03-12 DIAGNOSIS — F919 Conduct disorder, unspecified: Secondary | ICD-10-CM | POA: Diagnosis present

## 2016-03-12 DIAGNOSIS — F209 Schizophrenia, unspecified: Secondary | ICD-10-CM | POA: Insufficient documentation

## 2016-03-12 DIAGNOSIS — F17200 Nicotine dependence, unspecified, uncomplicated: Secondary | ICD-10-CM | POA: Insufficient documentation

## 2016-03-12 DIAGNOSIS — E119 Type 2 diabetes mellitus without complications: Secondary | ICD-10-CM | POA: Diagnosis not present

## 2016-03-12 LAB — CBC
HEMATOCRIT: 38.4 % — AB (ref 40.0–52.0)
Hemoglobin: 13.4 g/dL (ref 13.0–18.0)
MCH: 32.4 pg (ref 26.0–34.0)
MCHC: 35 g/dL (ref 32.0–36.0)
MCV: 92.4 fL (ref 80.0–100.0)
Platelets: 149 10*3/uL — ABNORMAL LOW (ref 150–440)
RBC: 4.15 MIL/uL — AB (ref 4.40–5.90)
RDW: 13.9 % (ref 11.5–14.5)
WBC: 5.4 10*3/uL (ref 3.8–10.6)

## 2016-03-12 LAB — URINE DRUG SCREEN, QUALITATIVE (ARMC ONLY)
AMPHETAMINES, UR SCREEN: NOT DETECTED
Barbiturates, Ur Screen: NOT DETECTED
Benzodiazepine, Ur Scrn: NOT DETECTED
CANNABINOID 50 NG, UR ~~LOC~~: NOT DETECTED
Cocaine Metabolite,Ur ~~LOC~~: NOT DETECTED
MDMA (ECSTASY) UR SCREEN: NOT DETECTED
Methadone Scn, Ur: NOT DETECTED
OPIATE, UR SCREEN: NOT DETECTED
PHENCYCLIDINE (PCP) UR S: NOT DETECTED
Tricyclic, Ur Screen: NOT DETECTED

## 2016-03-12 LAB — COMPREHENSIVE METABOLIC PANEL
ALBUMIN: 3.5 g/dL (ref 3.5–5.0)
ALT: 26 U/L (ref 17–63)
AST: 30 U/L (ref 15–41)
Alkaline Phosphatase: 27 U/L — ABNORMAL LOW (ref 38–126)
Anion gap: 8 (ref 5–15)
BILIRUBIN TOTAL: 0.5 mg/dL (ref 0.3–1.2)
BUN: 12 mg/dL (ref 6–20)
CHLORIDE: 98 mmol/L — AB (ref 101–111)
CO2: 25 mmol/L (ref 22–32)
CREATININE: 0.68 mg/dL (ref 0.61–1.24)
Calcium: 8.9 mg/dL (ref 8.9–10.3)
GFR calc Af Amer: >60 mL/min (ref >60)
GLUCOSE: 89 mg/dL (ref 65–99)
POTASSIUM: 4.1 mmol/L (ref 3.5–5.1)
Sodium: 131 mmol/L — ABNORMAL LOW (ref 135–145)
TOTAL PROTEIN: 6.7 g/dL (ref 6.5–8.1)

## 2016-03-12 LAB — ETHANOL

## 2016-03-12 LAB — ACETAMINOPHEN LEVEL: Acetaminophen (Tylenol), Serum: <10 ug/mL — ABNORMAL LOW (ref 10–30)

## 2016-03-12 LAB — SALICYLATE LEVEL: Salicylate Lvl: <4 mg/dL (ref 2.8–30.0)

## 2016-03-12 MED ORDER — DIVALPROEX SODIUM ER 500 MG PO TB24
500.0000 mg | ORAL_TABLET | Freq: Three times a day (TID) | ORAL | Status: DC
  Administered 2016-03-13 (×2): 500 mg via ORAL
  Filled 2016-03-12 (×2): qty 1

## 2016-03-12 MED ORDER — LACTULOSE 10 GM/15ML PO SOLN
10.0000 g | Freq: Every day | ORAL | Status: DC
  Administered 2016-03-13: 10 g via ORAL
  Filled 2016-03-12 (×2): qty 15

## 2016-03-12 MED ORDER — DONEPEZIL HCL 5 MG PO TABS
5.0000 mg | ORAL_TABLET | Freq: Every day | ORAL | Status: DC
  Administered 2016-03-13: 5 mg via ORAL
  Filled 2016-03-12 (×3): qty 1

## 2016-03-12 MED ORDER — METHOCARBAMOL 500 MG PO TABS
500.0000 mg | ORAL_TABLET | Freq: Three times a day (TID) | ORAL | Status: DC
  Administered 2016-03-13 (×2): 500 mg via ORAL
  Filled 2016-03-12 (×5): qty 1

## 2016-03-12 MED ORDER — ACETAMINOPHEN 325 MG PO TABS
650.0000 mg | ORAL_TABLET | Freq: Four times a day (QID) | ORAL | Status: DC | PRN
  Administered 2016-03-13: 650 mg via ORAL
  Filled 2016-03-12: qty 2

## 2016-03-12 MED ORDER — CITALOPRAM HYDROBROMIDE 20 MG PO TABS
40.0000 mg | ORAL_TABLET | Freq: Every day | ORAL | Status: DC
  Administered 2016-03-13: 40 mg via ORAL
  Filled 2016-03-12: qty 2

## 2016-03-12 MED ORDER — FLUPHENAZINE HCL 5 MG PO TABS
5.0000 mg | ORAL_TABLET | Freq: Three times a day (TID) | ORAL | Status: DC
  Administered 2016-03-13 (×2): 5 mg via ORAL
  Filled 2016-03-12 (×2): qty 1

## 2016-03-12 MED ORDER — CLONAZEPAM 0.5 MG PO TABS
0.5000 mg | ORAL_TABLET | Freq: Every day | ORAL | Status: DC | PRN
  Filled 2016-03-12: qty 1

## 2016-03-12 MED ORDER — PALIPERIDONE ER 3 MG PO TB24
12.0000 mg | ORAL_TABLET | Freq: Every day | ORAL | Status: DC
  Administered 2016-03-13: 12 mg via ORAL
  Filled 2016-03-12: qty 4
  Filled 2016-03-12: qty 2

## 2016-03-12 MED ORDER — BENZTROPINE MESYLATE 1 MG PO TABS
1.0000 mg | ORAL_TABLET | Freq: Two times a day (BID) | ORAL | Status: DC
  Administered 2016-03-13 (×2): 1 mg via ORAL
  Filled 2016-03-12: qty 1

## 2016-03-12 MED ORDER — CLONAZEPAM 0.5 MG PO TABS
0.5000 mg | ORAL_TABLET | Freq: Three times a day (TID) | ORAL | Status: DC
Start: 2016-03-13 — End: 2016-03-13
  Administered 2016-03-13 (×2): 0.5 mg via ORAL
  Filled 2016-03-12: qty 1

## 2016-03-12 MED ORDER — METFORMIN HCL 500 MG PO TABS
500.0000 mg | ORAL_TABLET | Freq: Two times a day (BID) | ORAL | Status: DC
  Administered 2016-03-13: 500 mg via ORAL
  Filled 2016-03-12: qty 1

## 2016-03-12 MED ORDER — GABAPENTIN 300 MG PO CAPS
300.0000 mg | ORAL_CAPSULE | Freq: Two times a day (BID) | ORAL | Status: DC
  Administered 2016-03-13 (×2): 300 mg via ORAL
  Filled 2016-03-12 (×2): qty 1

## 2016-03-12 MED ORDER — PROPRANOLOL HCL 20 MG PO TABS
20.0000 mg | ORAL_TABLET | Freq: Three times a day (TID) | ORAL | Status: DC
  Administered 2016-03-13: 20 mg via ORAL
  Filled 2016-03-12 (×4): qty 1

## 2016-03-12 MED ORDER — PRAZOSIN HCL 1 MG PO CAPS
1.0000 mg | ORAL_CAPSULE | Freq: Two times a day (BID) | ORAL | Status: DC
  Administered 2016-03-13: 1 mg via ORAL
  Filled 2016-03-12 (×2): qty 1

## 2016-03-12 NOTE — ED Notes (Signed)
[facility] called [facility]
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
## 2016-03-12 NOTE — BH Assessment (Signed)
Assessment Note  Jeremy Schultz is an 45 y.o. male.  Mr. Jeremy Schultz arrived to the ED by way of EMS.  He is reported as "trashing his group home".  He reports that he was upset due to staff member taking his DVD player and his chocolate Easter bunny.  Mr. Jeremy Schultz resides at [facility] home [phone]. He reports.that he won a prize of a chocolate Easter bunny at [employer], someone stole it and the home did nothing, he states that he does not Librarian, academictrust staff or residents.  He shared that staff has threatened to put him in jail for something he did not do.  He states that [place] and he does not like it there. He reports after the incident he walked off mad. He walked up to the church and he said that others believed that he was scaring people, but he says that he was taking a walk.  He does not like the language the staff uses towards him (cursing). He reports that he is very depressed right now. He denied symptoms of anxiety. He reports having auditory and visual hallucinations of the devil and devil is trying to get him.  He shared that he devil wants him to harm himself. He denied homicidal ideation or intent.  Patient appeared preoccupied with wanting to leave his group home and providing the information he believed he wanted for a new group home.  He appeared to want to be in the [place] area where he could get around more without staff.     TTS attempted to contact staff.  The calls went unanswered.  Diagnosis: schizoaffective disorder  Past Medical History:  Past Medical History  Diagnosis Date  . Hypertension   . Diabetes mellitus without complication (HCC)   . Seizures [facility](HCC)     Past Surgical History  Procedure Laterality Date  . Appendectomy      Family History: No family history on file.  Social History:  reports that he has been smoking.  He does not have any smokeless tobacco history on file. He reports that he does not drink alcohol or use illicit  drugs.  Additional Social History:  Alcohol / Drug Use History of alcohol / drug use?: No history of alcohol / drug abuse  CIWA: CIWA-Ar BP: 111/78 mmHg Pulse Rate: 61 COWS:    Allergies: No Known Allergies  Home Medications:  (Not in a hospital admission)  OB/GYN Status:  No LMP for male patient.  General Assessment Data Location of Assessment: [facility] TTS Assessment: In system Is this a Tele or Face-to-Face Assessment?: Face-to-Face Is this an Initial Assessment or a Re-assessment for this encounter?: Initial Assessment Marital status: Single Maiden name: n/a Is patient pregnant?: No Pregnancy Status: No Living Arrangements: Group Home ([facility] - [phone]) Can pt return to current living arrangement?: Yes Admission Status: Voluntary Is patient capable of signing voluntary admission?: No Referral Source: Self/Family/Friend Insurance type: Medicaid  Medical Screening Exam [facility] Walk-in ONLY) Medical Exam completed: Yes  Crisis Care Plan Living Arrangements: Group Home ([facility] - [phone]) Legal Guardian: Other: [facility]([place] [facility]) Name of Psychiatrist: None Name of Therapist: None  Education Status Is patient currently in school?: No Current Grade: n/a Highest grade of school patient has completed: Unsure Name of school: [employer]   Risk to self with the past 6 months Suicidal Ideation: Yes-Currently Present Has patient been a risk to self within the past 6 months  prior to admission? : No Suicidal Intent: No Has patient had any suicidal intent within the past 6 months prior to admission? : No Is patient at risk for suicide?: No Suicidal Plan?: No Has patient had any suicidal plan within the past 6 months prior to admission? : No Access to Means: No What has been your use of drugs/alcohol within the last 12 months?: denied use Previous Attempts/Gestures: No How many times?: 0 Other Self Harm Risks:  denied Triggers for Past Attempts: None known Intentional Self Injurious Behavior: None Family Suicide History: No Recent stressful life event(s): Conflict (Comment) (conflict with staff and residents at group home.) Persecutory voices/beliefs?: No Depression: Yes (by report) Depression Symptoms: Feeling angry/irritable Substance abuse history and/or treatment for substance abuse?: No Suicide prevention information given to non-admitted patients: Not applicable  Risk to Others within the past 6 months Homicidal Ideation: No Does patient have any lifetime risk of violence toward others beyond the six months prior to admission? : No Thoughts of Harm to Others: No Current Homicidal Intent: No Current Homicidal Plan: No Access to Homicidal Means: No Identified Victim: none identified History of harm to others?: No (Physical aggression towards house and objects) Assessment of Violence:  (violence towards objects) Violent Behavior Description: non physical violence Does patient have access to weapons?: No Criminal Charges Pending?: No Does patient have a court date: No Is patient on probation?: No  Psychosis Hallucinations: Auditory, Visual Delusions: None noted  Mental Status Report Appearance/Hygiene: In scrubs Eye Contact: Poor Motor Activity: Unremarkable Speech: Slow, Slurred Level of Consciousness: Drowsy Mood: Other (Comment) Affect: Appropriate to circumstance Anxiety Level: None Thought Processes: Coherent Judgement: Unimpaired Orientation: Person, Place, Time, Situation Obsessive Compulsive Thoughts/Behaviors: None  Cognitive Functioning Concentration: Fair Memory: Recent Intact IQ: Below Average Insight: Good Impulse Control: Fair Appetite: Good Sleep: No Change Vegetative Symptoms: None  ADLScreening [facility] Assessment Services) Patient's cognitive ability adequate to safely complete daily activities?: Yes Patient able to express need for assistance with  ADLs?: Yes Independently performs ADLs?: Yes (appropriate for developmental age)  Prior Inpatient Therapy Prior Inpatient Therapy: No (patient denied) Prior Therapy Dates: n/a Prior Therapy Facilty/Provider(s): n/a Reason for Treatment: n/a  Prior Outpatient Therapy Prior Outpatient Therapy: Yes Prior Therapy Dates: current Prior Therapy Facilty/Provider(s): unsure of name Reason for Treatment: schizoaffective Does patient have an ACCT team?: Yes Does patient have Intensive In-House Services?  : No Does patient have [facility] services? : No Does patient have P4CC services?: No  ADL Screening (condition at time of admission) Patient's cognitive ability adequate to safely complete daily activities?: Yes Patient able to express need for assistance with ADLs?: Yes Independently performs ADLs?: Yes (appropriate for developmental age)       Abuse/Neglect Assessment (Assessment to be complete while patient is alone) Physical Abuse: Denies Verbal Abuse: Yes, present (Comment) (He reports that people are saying bad things about him) Sexual Abuse: Denies Exploitation of patient/patient's resources: Denies Self-Neglect: Denies Values / Beliefs Cultural Requests During Hospitalization: None Spiritual Requests During Hospitalization: None   Advance Directives (For Healthcare) Does patient have an advance directive?: No    Additional Information 1:1 In Past 12 Months?: No CIRT Risk: No Elopement Risk: No Does patient have medical clearance?: No     Disposition:  Disposition Initial Assessment Completed for this Encounter: Yes Disposition of Patient: Other dispositions  On Site Evaluation by:   Reviewed with Physician:    Justice Deeds 03/12/2016 11:07 PM

## 2016-03-12 NOTE — ED Notes (Signed)
Pt brought in via ems from group home.  Pt got mad at staff for stealing his dvd player and chocolate easter bunny.  Pt trashed the place per ems.  Pt states SI and HI.  Pt calm and cooperative at this time

## 2016-03-13 MED ORDER — BENZTROPINE MESYLATE 1 MG PO TABS
ORAL_TABLET | ORAL | Status: AC
Start: 2016-03-13 — End: 2016-03-13
  Administered 2016-03-13: 1 mg via ORAL
  Filled 2016-03-13: qty 1

## 2016-03-13 NOTE — ED Notes (Signed)
Patient denies SI/HI/AVH and pain. All belongings returned to patient. Discharge instructions reviewed. Patient picked up by group home staff.

## 2016-03-13 NOTE — ED Notes (Signed)
Patient currently denies SI/HI/AVH and voices back pain rated a 5/10 in his lower back. Back pain addressed via tylenol 650 mg. Patient states that he feels sad because staff at the group home does not treat him well and he doesn't want to go back. He requests that we help him the best that we can to move group homes. Patient is calm and cooperative with slow but coherent speech. No signs of acute distress noted at this time. Maintained on 15 minute checks and observation by security camera for safety.

## 2016-03-13 NOTE — ED Notes (Signed)
Nurse called [facility] to notify them of patient's discharge. Administrator stated that they would be here within the hour to pick him up.

## 2016-03-13 NOTE — ED Notes (Signed)
Patient came to the nurse's station crying stating that he feels like he has no friends and no family and that the group home is mean to him. He cried further stating that he didn't want to go back to the group home because he doesn't deserve to be treated like he is. Nurse provided reassurance and provided diversional activities for the patient upon his request. Will continue to monitor. Maintained on 15 minute checks and observation by security camera for safety.

## 2016-03-13 NOTE — ED Notes (Signed)
Patient resting quietly in room. No noted distress or abnormal behaviors noted. Will continue 15 minute checks and observation by security camera for safety. 

## 2016-03-13 NOTE — ED Provider Notes (Signed)
-----------------------------------------   6:54 AM on 03/13/2016 -----------------------------------------   Blood pressure 119/75, pulse 63, temperature 97.8 F (36.6 C), temperature source Oral, resp. rate 16, height 5\' 10"  (1.778 m), weight 180 lb (81.647 kg), SpO2 98 %.  The patient had no acute events since last update.  Calm and cooperative at this time.  Disposition is pending per Psychiatry/Behavioral Medicine team recommendations.     Irean HongJade J Sung, MD 03/13/16 [phone]

## 2016-03-13 NOTE — ED Notes (Signed)
Sandwich and caffiene free, sugar free shasta drink given.

## 2016-03-13 NOTE — ED Notes (Signed)
Patient asleep in room. No noted distress or abnormal behavior. Will continue 15 minute checks and observation by security cameras for safety. 

## 2016-03-13 NOTE — ED Notes (Signed)
ENVIRONMENTAL ASSESSMENT Potentially harmful objects out of patient reach: Yes Personal belongings secured: Yes Patient dressed in hospital provided attire only: Yes Plastic bags out of patient reach: Yes Patient care equipment (cords, cables, call bells, lines, and drains) shortened, removed, or accounted for: Yes Equipment and supplies removed from bottom of stretcher: Yes Potentially toxic materials out of patient reach: Yes Sharps container removed or out of patient reach: Yes  Patient is currently in bed sleeping. No signs of distress noted. Maintained on 15 minute checks and observation by security camera for safety.

## 2016-03-13 NOTE — ED Notes (Signed)
Patient received breakfast tray 

## 2016-03-13 NOTE — ED Notes (Signed)
Patient in room resting at this time. No signs of acute distress noted. Maintained on 15 minute checks and observation by security camera for safety.

## 2016-03-13 NOTE — ED Notes (Signed)
Patient To [facility] from ED ambulatory without difficulty, to room. Reportreceived from  Amy, Charity fundraiserN. Pt. Is alert and oriented, warm and dry in no distress.Speech is slow but coherent. Pt. Denies SI, HI, and AVH. Patient remains calm and cooperative. Pt. made aware of security cameras and Q15 minute rounds for safety. Pt. Encouraged to let Nursing staff know of any concerns or needs. Drink and toilet offered.Will continue to monitor for safety.

## 2016-03-13 NOTE — ED Notes (Signed)
Patient is calm and resting in his room at this time. Patient spoke with chaplain about concerns and received support that he requested. No signs of acute distress. Maintained on 15 minute checks and observation by security camera for safety.

## 2016-03-13 NOTE — ED Notes (Signed)
Patient remains in room asleep at this time with no complaints or concerns voiced. No distress or abnormal behavior noted. Will continue to monitor with security cameras. Q15 minutes safety rounds maintained. Patient remains free from self harm.

## 2016-03-13 NOTE — Discharge Instructions (Signed)
Schizophrenia °Schizophrenia is a mental illness. It may cause disturbed or disorganized thinking, speech, or behavior. People with schizophrenia have problems functioning in one or more areas of life: work, school, home, or relationships. People with schizophrenia are at increased risk for suicide, certain chronic physical illnesses, and unhealthy behaviors, such as smoking and drug use. °People who have family members with schizophrenia are at higher risk of developing the illness. Schizophrenia affects men and women equally but usually appears at an earlier age (teenage or early adult years) in men.  °SYMPTOMS °The earliest symptoms are often subtle (prodrome) and may go unnoticed until the illness becomes more severe (first-break psychosis). Symptoms of schizophrenia may be continuous or may come and go in severity. Episodes often are triggered by major life events, such as family stress, college, military service, marriage, pregnancy or child birth, divorce, or loss of a loved one. People with schizophrenia may see, hear, or feel things that do not exist (hallucinations). They may have false beliefs in spite of obvious proof to the contrary (delusions). Sometimes speech is incoherent or behavior is odd or withdrawn.  °DIAGNOSIS °Schizophrenia is diagnosed through an assessment by your caregiver. Your caregiver will ask questions about your thoughts, behavior, mood, and ability to function in daily life. Your caregiver may ask questions about your medical history and use of alcohol or drugs, including prescription medication. Your caregiver may also order blood tests and imaging exams. Certain medical conditions and substances can cause symptoms that resemble schizophrenia. Your caregiver may refer you to a mental health specialist for evaluation. There are three major criterion for a diagnosis of schizophrenia: °· Two or more of the following five symptoms are present for a month or longer: °¨ Delusions. Often  the delusions are that you are being attacked, harassed, cheated, persecuted or conspired against (persecutory delusions). °¨ Hallucinations.   °¨ Disorganized speech that does not make sense to others. °¨ Grossly disorganized (confused or unfocused) behavior or extremely overactive or underactive motor activity (catatonia). °¨ Negative symptoms such as bland or blunted emotions (flat affect), loss of will power (avolition), and withdrawal from social contacts (social isolation). °· Level of functioning in one or more major areas of life (work, school, relationships, or self-care) is markedly below the level of functioning before the onset of illness.   °· There are continuous signs of illness (either mild symptoms or decreased level of functioning) for at least 6 months or longer. °TREATMENT  °Schizophrenia is a long-term illness. It is best controlled with continuous treatment rather than treatment only when symptoms occur. The following treatments are used to manage schizophrenia: °· Medication--Medication is the most effective and important form of treatment for schizophrenia. Antipsychotic medications are usually prescribed to help manage schizophrenia. Other types of medication may be added to relieve any symptoms that may occur despite the use of antipsychotic medications. °· Counseling or talk therapy--Individual, group, or family counseling may be helpful in providing education, support, and guidance. Many people with schizophrenia also benefit from social skills and job skills (vocational) training. °A combination of medication and counseling is best for managing the disorder over time. A procedure in which electricity is applied to the brain through the scalp (electroconvulsive therapy) may be used to treat catatonic schizophrenia or schizophrenia in people who cannot take or do not respond to medication and counseling. °  °This information is not intended to replace advice given to you by your health  care provider. Make sure you discuss any questions you have with   your health care provider.   Document Released: 11/16/2000 Document Revised: 12/10/2014 Document Reviewed: 02/11/2013 Elsevier Interactive Patient Education [employer]2016 [employer]   Return to the group home continue following up with your regular treatment provider.

## 2016-05-09 ENCOUNTER — Emergency Department (HOSPITAL_COMMUNITY)
Admission: EM | Admit: 2016-05-09 | Discharge: 2016-05-10 | Disposition: A | Discharge status: Home / Self Care | Payer: Medicaid Other | Attending: Emergency Medicine | Admitting: Emergency Medicine

## 2016-05-09 ENCOUNTER — Encounter (HOSPITAL_COMMUNITY): Payer: Self-pay | Admitting: Emergency Medicine

## 2016-05-09 DIAGNOSIS — R451 Restlessness and agitation: Secondary | ICD-10-CM

## 2016-05-09 DIAGNOSIS — F209 Schizophrenia, unspecified: Secondary | ICD-10-CM | POA: Diagnosis not present

## 2016-05-09 DIAGNOSIS — I10 Essential (primary) hypertension: Secondary | ICD-10-CM | POA: Diagnosis not present

## 2016-05-09 DIAGNOSIS — Z046 Encounter for general psychiatric examination, requested by authority: Secondary | ICD-10-CM | POA: Diagnosis present

## 2016-05-09 DIAGNOSIS — F17210 Nicotine dependence, cigarettes, uncomplicated: Secondary | ICD-10-CM | POA: Insufficient documentation

## 2016-05-09 DIAGNOSIS — Z79899 Other long term (current) drug therapy: Secondary | ICD-10-CM | POA: Insufficient documentation

## 2016-05-09 DIAGNOSIS — E119 Type 2 diabetes mellitus without complications: Secondary | ICD-10-CM | POA: Diagnosis not present

## 2016-05-09 DIAGNOSIS — Z7984 Long term (current) use of oral hypoglycemic drugs: Secondary | ICD-10-CM | POA: Diagnosis not present

## 2016-05-09 HISTORY — DX: Schizophrenia, unspecified: F20.9

## 2016-05-09 LAB — CBC WITH DIFFERENTIAL/PLATELET
Basophils Absolute: 0 10*3/uL (ref 0.0–0.1)
Basophils Relative: 0 %
EOS PCT: 2 %
Eosinophils Absolute: 0.1 10*3/uL (ref 0.0–0.7)
HCT: 37 % — ABNORMAL LOW (ref 39.0–52.0)
Hemoglobin: 13.2 g/dL (ref 13.0–17.0)
LYMPHS ABS: 1.6 10*3/uL (ref 0.7–4.0)
LYMPHS PCT: 43 %
MCH: 32.7 pg (ref 26.0–34.0)
MCHC: 35.7 g/dL (ref 30.0–36.0)
MCV: 91.6 fL (ref 78.0–100.0)
MONOS PCT: 16 %
Monocytes Absolute: 0.6 10*3/uL (ref 0.1–1.0)
Neutro Abs: 1.4 10*3/uL — ABNORMAL LOW (ref 1.7–7.7)
Neutrophils Relative %: 39 %
PLATELETS: 140 10*3/uL — AB (ref 150–400)
RBC: 4.04 MIL/uL — AB (ref 4.22–5.81)
RDW: 12.8 % (ref 11.5–15.5)
WBC: 3.6 10*3/uL — AB (ref 4.0–10.5)

## 2016-05-09 LAB — RAPID URINE DRUG SCREEN, HOSP PERFORMED
AMPHETAMINES: NOT DETECTED
Barbiturates: NOT DETECTED
Benzodiazepines: NOT DETECTED
Cocaine: NOT DETECTED
Opiates: NOT DETECTED
TETRAHYDROCANNABINOL: NOT DETECTED

## 2016-05-09 LAB — BASIC METABOLIC PANEL
Anion gap: 6 (ref 5–15)
BUN: 11 mg/dL (ref 6–20)
CALCIUM: 8.7 mg/dL — AB (ref 8.9–10.3)
CHLORIDE: 95 mmol/L — AB (ref 101–111)
CO2: 27 mmol/L (ref 22–32)
CREATININE: 0.8 mg/dL (ref 0.61–1.24)
GFR calc non Af Amer: >60 mL/min (ref >60)
GLUCOSE: 107 mg/dL — AB (ref 65–99)
Potassium: 4.2 mmol/L (ref 3.5–5.1)
Sodium: 128 mmol/L — ABNORMAL LOW (ref 135–145)

## 2016-05-09 LAB — ETHANOL: Alcohol, Ethyl (B): <5 mg/dL (ref <5)

## 2016-05-09 MED ORDER — LORATADINE 10 MG PO TABS
10.0000 mg | ORAL_TABLET | Freq: Every day | ORAL | Status: DC
  Administered 2016-05-10: 10 mg via ORAL
  Filled 2016-05-09: qty 1

## 2016-05-09 MED ORDER — GABAPENTIN 300 MG PO CAPS
300.0000 mg | ORAL_CAPSULE | Freq: Two times a day (BID) | ORAL | Status: DC
  Administered 2016-05-09 – 2016-05-10 (×2): 300 mg via ORAL
  Filled 2016-05-09 (×2): qty 1

## 2016-05-09 MED ORDER — LACTULOSE 10 GM/15ML PO SOLN
10.0000 g | Freq: Every day | ORAL | Status: DC
  Administered 2016-05-10: 10 g via ORAL
  Filled 2016-05-09: qty 30

## 2016-05-09 MED ORDER — PRAZOSIN HCL 1 MG PO CAPS
ORAL_CAPSULE | ORAL | Status: AC
  Filled 2016-05-09: qty 1

## 2016-05-09 MED ORDER — METFORMIN HCL 500 MG PO TABS
500.0000 mg | ORAL_TABLET | Freq: Two times a day (BID) | ORAL | Status: DC
  Administered 2016-05-10: 500 mg via ORAL
  Filled 2016-05-09: qty 1

## 2016-05-09 MED ORDER — METHOCARBAMOL 500 MG PO TABS
500.0000 mg | ORAL_TABLET | Freq: Three times a day (TID) | ORAL | Status: DC
  Administered 2016-05-09 – 2016-05-10 (×3): 500 mg via ORAL
  Filled 2016-05-09 (×3): qty 1

## 2016-05-09 MED ORDER — ACETAMINOPHEN 325 MG PO TABS: 650.0000 mg | ORAL_TABLET | Freq: Four times a day (QID) | ORAL | Status: DC | PRN

## 2016-05-09 MED ORDER — CLONAZEPAM 0.5 MG PO TABS
0.5000 mg | ORAL_TABLET | Freq: Three times a day (TID) | ORAL | Status: DC
  Administered 2016-05-09 – 2016-05-10 (×3): 0.5 mg via ORAL
  Filled 2016-05-09 (×3): qty 1

## 2016-05-09 MED ORDER — BENZTROPINE MESYLATE 1 MG PO TABS
1.0000 mg | ORAL_TABLET | Freq: Two times a day (BID) | ORAL | Status: DC
  Administered 2016-05-09 – 2016-05-10 (×2): 1 mg via ORAL
  Filled 2016-05-09 (×2): qty 1

## 2016-05-09 MED ORDER — DIVALPROEX SODIUM ER 500 MG PO TB24
1000.0000 mg | ORAL_TABLET | Freq: Two times a day (BID) | ORAL | Status: DC
  Administered 2016-05-09 – 2016-05-10 (×2): 1000 mg via ORAL
  Filled 2016-05-09 (×2): qty 2

## 2016-05-09 MED ORDER — PROPRANOLOL HCL 10 MG PO TABS
20.0000 mg | ORAL_TABLET | Freq: Three times a day (TID) | ORAL | Status: DC
  Administered 2016-05-09 – 2016-05-10 (×3): 20 mg via ORAL
  Filled 2016-05-09 (×3): qty 2

## 2016-05-09 MED ORDER — PRAZOSIN HCL 1 MG PO CAPS
1.0000 mg | ORAL_CAPSULE | Freq: Two times a day (BID) | ORAL | Status: DC
  Administered 2016-05-09 – 2016-05-10 (×2): 1 mg via ORAL
  Filled 2016-05-09 (×4): qty 1

## 2016-05-09 MED ORDER — DONEPEZIL HCL 5 MG PO TABS
ORAL_TABLET | ORAL | Status: AC
Start: 2016-05-09 — End: 2016-05-09
  Filled 2016-05-09: qty 1

## 2016-05-09 MED ORDER — PALIPERIDONE ER 6 MG PO TB24
12.0000 mg | ORAL_TABLET | Freq: Every day | ORAL | Status: DC
  Administered 2016-05-10: 12 mg via ORAL
  Filled 2016-05-09 (×2): qty 2

## 2016-05-09 MED ORDER — PANTOPRAZOLE SODIUM 40 MG PO TBEC
40.0000 mg | DELAYED_RELEASE_TABLET | Freq: Every day | ORAL | Status: DC
  Administered 2016-05-10: 40 mg via ORAL
  Filled 2016-05-09: qty 1

## 2016-05-09 MED ORDER — FLUTICASONE PROPIONATE 50 MCG/ACT NA SUSP
2.0000 | Freq: Every day | NASAL | Status: DC
  Administered 2016-05-10: 2 via NASAL
  Filled 2016-05-09: qty 16

## 2016-05-09 MED ORDER — SALINE SPRAY 0.65 % NA SOLN
2.0000 | Freq: Three times a day (TID) | NASAL | Status: DC | PRN
  Filled 2016-05-09: qty 44

## 2016-05-09 MED ORDER — HALOPERIDOL 0.5 MG PO TABS
1.0000 mg | ORAL_TABLET | Freq: Two times a day (BID) | ORAL | Status: DC
  Administered 2016-05-09 – 2016-05-10 (×2): 1 mg via ORAL
  Filled 2016-05-09 (×4): qty 1

## 2016-05-09 MED ORDER — DONEPEZIL HCL 5 MG PO TABS
5.0000 mg | ORAL_TABLET | Freq: Every day | ORAL | Status: DC
  Administered 2016-05-09: 5 mg via ORAL
  Filled 2016-05-09 (×2): qty 1

## 2016-05-09 MED ORDER — HALOPERIDOL 2 MG PO TABS
ORAL_TABLET | ORAL | Status: AC
  Filled 2016-05-09: qty 1

## 2016-05-09 MED ORDER — CITALOPRAM HYDROBROMIDE 20 MG PO TABS
40.0000 mg | ORAL_TABLET | Freq: Every day | ORAL | Status: DC
  Administered 2016-05-10: 40 mg via ORAL
  Filled 2016-05-09 (×2): qty 1

## 2016-05-09 MED ORDER — HYDROCERIN EX CREA
1.0000 "application " | TOPICAL_CREAM | Freq: Two times a day (BID) | CUTANEOUS | Status: DC
  Administered 2016-05-10: 1 via TOPICAL
  Filled 2016-05-09: qty 113

## 2016-05-09 NOTE — ED Provider Notes (Signed)
CSN: [id number]     Arrival date & time 05/09/16  1550 History   First MD Initiated Contact with Patient 05/09/16 1555     No chief complaint on file.    HPI Pt was seen at 1600. Per EMS and pt report: Pt states he "woke up in a bad mood" today. Group Home staff wanted to take pt shopping and he did not want to go. Pt states he "got mad" and "wanted to hurt himself and others." EMS states Group Home staff told them pt was "ripping things off the wall" and "said he was going to shoot them." Pt states he "hears voices that are angry." Currently denies SI/HI.    Past Medical History  Diagnosis Date  . Hypertension   . Diabetes mellitus without complication (HCC)   . Seizures (HCC)   . Schizophrenia [facility](HCC)    Past Surgical History  Procedure Laterality Date  . Appendectomy      Social History  Substance Use Topics  . Smoking status: Current Every Day Smoker  . Smokeless tobacco: None  . Alcohol Use: No    Review of Systems ROS: Statement: All systems negative except as marked or noted in the HPI; Constitutional: Negative for fever and chills. ; ; Eyes: Negative for eye pain, redness and discharge. ; ; ENMT: Negative for ear pain, hoarseness, nasal congestion, sinus pressure and sore throat. ; ; Cardiovascular: Negative for chest pain, palpitations, diaphoresis, dyspnea and peripheral edema. ; ; Respiratory: Negative for cough, wheezing and stridor. ; ; Gastrointestinal: Negative for nausea, vomiting, diarrhea, abdominal pain, blood in stool, hematemesis, jaundice and rectal bleeding. . ; ; Genitourinary: Negative for dysuria, flank pain and hematuria. ; ; Musculoskeletal: Negative for back pain and neck pain. Negative for swelling and trauma.; ; Skin: Negative for pruritus, rash, abrasions, blisters, bruising and skin lesion.; ; Neuro: Negative for headache, lightheadedness and neck stiffness. Negative for weakness, altered level of consciousness, altered mental status, extremity weakness,  paresthesias, involuntary movement, seizure and syncope.; Psych:  +agitation, auditory hallucinations. No SI, no SA, no HI.       Allergies  Review of patient's allergies indicates no known allergies.  Home Medications   Prior to Admission medications   Medication Sig Start Date End Date Taking? Authorizing Provider  acetaminophen (TYLENOL) 325 MG tablet Take 650 mg by mouth every 6 (six) hours as needed for moderate pain.    Historical Provider, MD  benztropine (COGENTIN) 1 MG tablet Take 1 mg by mouth 2 (two) times daily.    Historical Provider, MD  citalopram (CELEXA) 40 MG tablet Take 40 mg by mouth daily.    Historical Provider, MD  clonazePAM (KLONOPIN) 0.5 MG tablet Take 0.5 mg by mouth 3 (three) times daily.    Historical Provider, MD  clonazePAM (KLONOPIN) 0.5 MG tablet Take 0.5 mg by mouth daily as needed for anxiety.    Historical Provider, MD  divalproex (DEPAKOTE ER) 500 MG 24 hr tablet Take 500 mg by mouth 3 (three) times daily.    Historical Provider, MD  donepezil (ARICEPT) 5 MG tablet Take 5 mg by mouth at bedtime.    Historical Provider, MD  fluPHENAZine (PROLIXIN) 5 MG tablet Take 5 mg by mouth 3 (three) times daily.    Historical Provider, MD  gabapentin (NEURONTIN) 300 MG capsule Take 300 mg by mouth 2 (two) times daily.    Historical Provider, MD  lactulose (CHRONULAC) 10 GM/15ML solution Take 10 g by mouth daily.  Historical Provider, MD  metFORMIN (GLUCOPHAGE) 500 MG tablet Take 500 mg by mouth 2 (two) times daily with a meal.    Historical Provider, MD  methocarbamol (ROBAXIN) 500 MG tablet Take 500 mg by mouth 3 (three) times daily.    Historical Provider, MD  omeprazole (PRILOSEC) 20 MG capsule Take 40 mg by mouth 2 (two) times daily.    Historical Provider, MD  paliperidone (INVEGA) 6 MG 24 hr tablet Take 12 mg by mouth daily.    Historical Provider, MD  prazosin (MINIPRESS) 1 MG capsule Take 1 mg by mouth 2 (two) times daily.    Historical Provider, MD   propranolol (INDERAL) 20 MG tablet Take 20 mg by mouth 3 (three) times daily.    Historical Provider, MD  Skin Protectants, Misc. (EUCERIN) cream Apply 1 application topically 2 (two) times daily.    Historical Provider, MD  sodium chloride (OCEAN) 0.65 % SOLN nasal spray Place 2 sprays into both nostrils 3 (three) times daily as needed for congestion.    Historical Provider, MD   BP 106/68 mmHg  Pulse 68  Temp(Src) 98.7 F (37.1 C) (Oral)  Resp 16  Ht 5\' 8"  (1.727 m)  Wt 187 lb (84.823 kg)  BMI 28.44 kg/m2  SpO2 98% Physical Exam  1605: Physical examination:  Nursing notes reviewed; Vital signs and O2 SAT reviewed;  Constitutional: Well developed, Well nourished, Well hydrated, In no acute distress; Head:  Normocephalic, atraumatic; Eyes: EOMI, PERRL, No scleral icterus; ENMT: Mouth and pharynx normal, Mucous membranes moist; Neck: Supple, Full range of motion; Cardiovascular: Regular rate and rhythm; Respiratory: Breath sounds clear, No wheezes.  Speaking full sentences with ease, Normal respiratory effort/excursion; Chest: No deformity, Movement normal; Abdomen: Nondistended; Extremities: No deformity.; Neuro: AA&Ox3, Major CN grossly intact.  Speech clear. No gross focal motor deficits in extremities. Climbs on and off stretcher easily by himself. Gait steady.; Skin: Color normal, Warm, Dry.; Psych:  Affect flat. Poor eye contact. Endorses auditory hallucinations.     ED Course  Procedures (including critical care time) Labs Review  Imaging Review  I have personally reviewed and evaluated these images and lab results as part of my medical decision-making.   EKG Interpretation None      MDM  MDM Reviewed: previous chart, nursing note and vitals Reviewed previous: labs Interpretation: labs     Results for orders placed or performed during the hospital encounter of 05/09/16  Basic metabolic panel  Result Value Ref Range   Sodium 128 (L) 135 - 145 mmol/L   Potassium 4.2  3.5 - 5.1 mmol/L   Chloride 95 (L) 101 - 111 mmol/L   CO2 27 22 - 32 mmol/L   Glucose, Bld 107 (H) 65 - 99 mg/dL   BUN 11 6 - 20 mg/dL   Creatinine, Ser 1.61 0.61 - 1.24 mg/dL   Calcium 8.7 (L) 8.9 - 10.3 mg/dL   GFR calc non Af Amer >60 >60 mL/min   GFR calc Af Amer >60 >60 mL/min   Anion gap 6 5 - 15  Ethanol  Result Value Ref Range   Alcohol, Ethyl (B) <5 <5 mg/dL  CBC with Differential  Result Value Ref Range   WBC 3.6 (L) 4.0 - 10.5 K/uL   RBC 4.04 (L) 4.22 - 5.81 MIL/uL   Hemoglobin 13.2 13.0 - 17.0 g/dL   HCT 09.6 (L) 04.5 - 40.9 %   MCV 91.6 78.0 - 100.0 fL   MCH 32.7 26.0 - 34.0 pg  MCHC 35.7 30.0 - 36.0 g/dL   RDW 78.2 95.6 - 21.3 %   Platelets 140 (L) 150 - 400 K/uL   Neutrophils Relative % 39 %   Neutro Abs 1.4 (L) 1.7 - 7.7 K/uL   Lymphocytes Relative 43 %   Lymphs Abs 1.6 0.7 - 4.0 K/uL   Monocytes Relative 16 %   Monocytes Absolute 0.6 0.1 - 1.0 K/uL   Eosinophils Relative 2 %   Eosinophils Absolute 0.1 0.0 - 0.7 K/uL   Basophils Relative 0 %   Basophils Absolute 0.0 0.0 - 0.1 K/uL  Urine rapid drug screen (hosp performed)  Result Value Ref Range   Opiates NONE DETECTED NONE DETECTED   Cocaine NONE DETECTED NONE DETECTED   Benzodiazepines NONE DETECTED NONE DETECTED   Amphetamines NONE DETECTED NONE DETECTED   Tetrahydrocannabinol NONE DETECTED NONE DETECTED   Barbiturates NONE DETECTED NONE DETECTED    1825:  UDS pending results. Mild hyponatremia; has tol PO food and fluids well while in the ED. Pt now telling ED staff he "needs to go to a ward" and "I want to hurt myself."   1855:  TTS eval pending.   2100:  TTS evaluation complete: recommendations pending. Holding orders written.     Samuel Jester, DO 05/09/16 2100

## 2016-05-09 NOTE — BH Assessment (Addendum)
Tele Assessment Note   Jeremy Schultz is an 45 y.o.single male who was brought to the [facility] tonight via EMS after a call from his group home staff due to an anger outburst. Pt sts that he became angry when staff wanted him to go shopping with them today but he "did not feel like it." Pt is a poor historian and no one from his GH was available in person or by phone. Pt st[facility] and that the staff does not listen to his wishes. Pt sts that he does not even feel like going to church which he sts is important to him because he is taken to a "black church" where he feels he does not fit in.  Pt sts he feels like he is "in prison" and is living in a "depressing place deep in the woods." Pt sts he has continued to have SI "off and on" over the last few months and today "came this close to actually walking into traffic to be hit by a car" with hopes of being killed. Pt denies any suicide attempts but pt record indicates 1 attempt at age 45 yo when he tried to cut his arm off. Pt sts he [facility] and other [facility] and wants to hurt them stating that he thinks about it but ends up destroying property instead. Pt was in the ED 03/13/16 for similar symptoms fo SI, depression, anger outburst with property damage. Pt record indicates a hx of property damage. Pt sts that he believes he has charges pending against him from that incident. Pt denies hurting anyone. Pt sts that he continually has AH, hearing voices that are "evail, bad and killers."  Pt sts that the voices tell him "to murder myself and everybody ." [facility].   Pt currently lives at [facility] [phone]) and per record has been living there about 3 years. [facility] but no answer at the building where pt lived each time called. [place] [facility]. Per pt record, he appears to be  [place] Pt sts that he does not currently have a psychiatrist or a therapist.  Pt sts he has never been psychiatrically hospitalized but pt record indicates that years ago pt [facility] and at a hospital in [place].  Pt sts that he has had a therapist in the past but sts he cannot remember his name.  Pt denies physical and sexual abuse but sts he has been verbally/emotionally bullied most of his life. Pt sts he does not drink alcohol or take recreational drugs but sts he does smoke 16 cigarettes per day. Pt sts he wants to stop because he wants to use that money for something else. There is a note in pt's record from April, 2017, that he ahs an ACT team but no mention of the agency.  Pt was unable to state whether he had an ACTT or if yes, which agency.   Pt was dressed in scrubs and sitting on his hospital bed holding a Bible. Pt was alert, cooperative and irritable in general, still [facility]. Pt kept good eye contact, spoke in a slow, slurred tone and at a rapid, pressured pace. Pt moved in a normal manner when moving. Pt's thought process was  coherent and relevant although pt remained focused on wanting to move to another [facility]. Judgement was impaired.  No indication of response to internal stimuli. Appeared to have a delusion of persecution by others. Pt's mood was stated to be depressed but not anxious and his flat affect was congruent.  Pt was oriented x 4, to person, place, time and situation.   Diagnosis: Schizophrenia by hx  Past Medical History:  Past Medical History  Diagnosis Date  . Hypertension   . Diabetes mellitus without complication (HCC)   . Seizures (HCC)   . Schizophrenia [facility])     Past Surgical History  Procedure Laterality Date  . Appendectomy      Family History: No family history on file.  Social History:  reports that he has been smoking.  He does not have any smokeless tobacco history on file. He  reports that he does not drink alcohol or use illicit drugs.  Additional Social History:  Alcohol / Drug Use Prescriptions: See PTA list History of alcohol / drug use?: Yes Longest period of sobriety (when/how long): unknown Substance #1 Name of Substance 1: Nicotine/Cigarettes 1 - Age of First Use: teens 1 - Amount (size/oz): 16 cigarettes 1 - Frequency: day 1 - Duration: ongoing 1 - Last Use / Amount: today- pt sts he is going to stop to save that expense  CIWA: CIWA-Ar BP: 106/68 mmHg Pulse Rate: 68 COWS:    PATIENT STRENGTHS: (choose at least two) Communication skills Religious Affiliation  Allergies: No Known Allergies  Home Medications:  (Not in a hospital admission)  OB/GYN Status:  No LMP for male patient.  General Assessment Data [facility] of Assessment: [facility] TTS Assessment: In system Is this a Tele or Face-to-Face Assessment?: Tele Assessment Is this an Initial Assessment or a Re-assessment for this encounter?: Initial Assessment Marital status: Single Maiden name: na Is patient pregnant?: No Pregnancy Status: No Living Arrangements:  ([facility] [phone]) Can pt return to current living arrangement?:  (uncertain) Admission Status: Voluntary Is patient capable of signing voluntary admission?: Yes Referral Source: Self/Family/Friend [facility] staff) Insurance type: Medicaid  Medical Screening Exam [facility] Walk-in ONLY) Medical Exam completed: Yes  Crisis Care Plan Living Arrangements:  ([facility] [phone]) Legal Guardian: Other: [place]) Name of Psychiatrist: none Name of Therapist: none  Education Status Is patient currently in school?: No Current Grade: na Highest grade of school patient has completed:  (uncertain) Name of school: [employer] Contact person: na  Risk to self with the past 6 months Suicidal Ideation: Yes-Currently Present Has patient been a risk to self within the past 6 months prior to  admission? : Yes Suicidal Intent: Yes-Currently Present Has patient had any suicidal intent within the past 6 months prior to admission? : Yes Is patient at risk for suicide?: Yes Suicidal Plan?: Yes-Currently Present Has patient had any suicidal plan within the past 6 months prior to admission? : Yes Specify Current Suicidal Plan: plan to walk out into traffic to be hit and killed Access to Means: Yes What has been your use of drugs/alcohol within the last 12 months?: no alcohol or recreational drugs Previous Attempts/Gestures: No (denies) How many times?: 0 Other Self Harm Risks: none noted Triggers for Past Attempts:  (na) Intentional Self Injurious Behavior:  (none noted) Family Suicide History: No Recent stressful life event(s): Conflict (Comment), Financial Problems (conflict w [facility] staff & residents; dislikes GH) Persecutory voices/beliefs?: Yes Depression: Yes Depression Symptoms: Despondent, Insomnia, Tearfulness, Isolating,  Fatigue, Guilt, Loss of interest in usual pleasures, Feeling worthless/self pity, Feeling angry/irritable Substance abuse history and/or treatment for substance abuse?: No Suicide prevention information given to non-admitted patients: Not applicable  Risk to Others within the past 6 months Homicidal Ideation: Yes-Currently Present Does patient have any lifetime risk of violence toward others beyond the six months prior to admission? : Yes (comment) (property damage only; verbal threats to harm others) Thoughts of Harm to Others: Yes-Currently Present (sts yes, thoughts of harming staff & residents) Current Homicidal Intent: No (denies) Current Homicidal Plan: No Access to Homicidal Means: No (denies) Identified Victim: [facility] staff & residents (made verbal threats at [facility] today) History of harm to others?: No (denies; none noted ) Assessment of Violence: On admission (property damage to [facility]) Violent Behavior Description: na Does patient have access to weapons?:  No Criminal Charges Pending?: Yes (sts has charges for damaging property-GH) Describe Pending Criminal Charges:  ( ) Does patient have a court date:  (unknown) Is patient on probation?: Unknown  Psychosis Hallucinations: Auditory (hears voices telling him "to murder myself & everyone") Delusions: Persecutory  Mental Status Report Appearance/Hygiene: In scrubs, Unremarkable Eye Contact: Good Motor Activity: Agitation, Freedom of movement, Unremarkable Speech: Slow, Logical/coherent, Rapid, Pressured, Slurred Level of Consciousness: Alert Mood: Depressed, Angry, Preoccupied (focused on getting to another GH to live) Affect: Angry, Depressed, Flat, Preoccupied, Irritable Anxiety Level: None Thought Processes: Coherent, Relevant, Circumstantial (focused on anger w current GH staff) Judgement: Impaired Orientation: Person, Place, Time, Situation Obsessive Compulsive Thoughts/Behaviors: None  Cognitive Functioning Concentration: Poor Memory: Recent Intact, Remote Intact IQ:  (uncertain-school in record as Special Ed) Insight: Poor Impulse Control: Poor Appetite: Good Weight Loss: 0 Weight Gain: 0 Sleep: Decreased Total Hours of Sleep: 4 (3-4 average) Vegetative Symptoms: None  ADLScreening [facility] Assessment Services) Patient's cognitive ability adequate to safely complete daily activities?: Yes Patient able to express need for assistance with ADLs?: Yes Independently performs ADLs?: Yes (appropriate for developmental age)  Prior Inpatient Therapy Prior Inpatient Therapy: No (denied- 0 in pt record) Prior Therapy Dates: na Prior Therapy Facilty/Provider(s): na Reason for Treatment: na  Prior Outpatient Therapy Prior Outpatient Therapy: Yes Prior Therapy Dates:  (uncertain) Prior Therapy Facilty/Provider(s):  (uncertain-pt could not answer) Reason for Treatment: Schizophrenia; Schizoaffective Does patient have an ACCT team?: Unknown (saw ACTT mention in old pt record but no  name given) Does patient have Intensive In-House Services?  : No Does patient have [facility] services? : Unknown Does patient have P4CC services?: Unknown  ADL Screening (condition at time of admission) Patient's cognitive ability adequate to safely complete daily activities?: Yes Patient able to express need for assistance with ADLs?: Yes Independently performs ADLs?: Yes (appropriate for developmental age)       Abuse/Neglect Assessment (Assessment to be complete while patient is alone) Physical Abuse: Denies Verbal Abuse: Yes, past (Comment), Yes, present (Comment) (sts he has been and is bullied by others verbally) Sexual Abuse: Denies Exploitation of patient/patient's resources: Denies Self-Neglect: Denies     Merchant navy officer (For Healthcare) Does patient have an advance directive?: No Would patient like information on creating an advanced directive?: No - patient declined information    Additional Information 1:1 In Past 12 Months?: No CIRT Risk:  (possibly) Elopement Risk: No (per hx) Does patient have medical clearance?: Yes     Disposition:  Disposition Initial Assessment Completed for this Encounter: Yes Disposition of Patient: Other dispositions (Pending review w [facility] Extender) Other disposition(s): Other (Comment)  Per Malachy Chamber,  NP: Pt does not meet IP criteria.  Recommend re-evaluation by psychiatry during day shift for final disposition.  Advised EDP of recommendation.  She agreed.   Beryle Flock, MS, CRC, [facility] Triage Specialist [facility] T 05/09/2016 8:58 PM

## 2016-05-09 NOTE — ED Notes (Addendum)
Went in to check on patient and pt reports " i want to hurt myself and go to a ward." pt denies current plan. EDP aware. Sitter at bedside.

## 2016-05-09 NOTE — ED Notes (Signed)
Security wanding patient. Labs collected by lab personnel.

## 2016-05-09 NOTE — ED Notes (Signed)
Pt states he woke up in a bad mood this morning. States " they " wanted to take me shopping and I didn't want to go . They got mad at me, so it was there fault. Pt states he has no plans to hurt himself or others today. Per EMS, they were told by staff at [facility] that patient was ripping things off the wall and said he was going to shoot them.

## 2016-05-10 DIAGNOSIS — F200 Paranoid schizophrenia: Secondary | ICD-10-CM

## 2016-05-10 NOTE — Progress Notes (Signed)
Contacted pt's group home [facility], [phone]- Larkin InaErica Jones. She states that they brought pt to ED due to his "behaviors worsening over the last 2 weeks." Described behavior as "following one of the other residents around, threatening them. The other resident became afraid." States that behavior is not pt's baseline in their experience. States pt does have hx of expressing SI which, per chart, quickly subsides following ED admission. She states pt has been hospitalized for behavioral health issues in the past but "it has been some time." reports he is typically well-managed outpatient with services from ACTT. Reports the dx they have on file for pt is schizoaffective d/o. States pt last saw ACTT 6 days ago. Reports pt's ACTT is through "[facility] [place]." Reports contact is "Ladene ArtistDerrick" but does not have contact information. CSW attempted to locate this agency with no success. Pt's legal guardian is [facility] [place] [facility]- Purvis Sheffieldarryl Moore [phone]. CSW left voicemail requesting returned call. Ms Yetta BarreJones states she is faxing guardianship paperwork to Clinical research associatewriter.  Ms. Yetta BarreJones aware pt is awaiting re-evaluation to determine disposition recommendation and can be reached at number above to follow up once pt evaluated.  Ilean SkillMeghan Talajah Slimp, MSW, LCSW Clinical Social Work, Disposition  05/10/2016 [phone]

## 2016-05-10 NOTE — Consult Note (Signed)
Telepsych Consultation   Reason for Consult:Threats to hurt himself & others Referring Physician: EDP  Patient Identification: Jeremy Schultz  MRN:  [id number]  Principal Diagnosis: Schizophrenia. Diagnosis:   Patient Active Problem List   Diagnosis Date Noted  . Schizophrenia ([place]) [F20.9] 09/01/2015  . High blood pressure [I10] 09/01/2015  . Seizure disorder ([place]) [E17.408] 09/01/2015   Total Time spent with patient: 45 minutes  Subjective:   [name] is a 45 y.o. male patient admitted with complaints of suicidal & homicidal thoughts.  HPI: Jeremy Schultz is a 45 year old Caucasian male that resides in a group home. He was brought to the [facility] ED with complaints of "I woke up in a bad mood today" & threatened suicidal/homicidal ideations after staff wanted to take him out for shopping. During this telepsych assessment, conducted while patient still at the ED, he reports, "The staff at home pissed me off today. There is nothing to do in this home. They keep taking me to this one store over & over again. They are doing nothing for me. I don't want to live in this home any more. I want to go back to living at the [facility] group home in [place], [place]. I have lived there in the past. There are a lot of fun stuff to do at the [facility] like the [employer], [place] & different places to shop. But, in this group home, all they do is take me to the same store all the time. I will sue them. I need to get out of this home or somebody is gonna get in trouble". During this assessment, Jeremy Schultz presents with restricted affect, barely making eye contact. He adamantly denies any SIHI, AVH, delusional thoughts or paranoia. He appears to not responding to any internal stimuli. He appears to be in no apparent distress.  Past Psychiatric History: Hx. Schizophrenia  Risk to Self: Suicidal Ideation: Yes-Currently Present Suicidal Intent: Yes-Currently Present Is  patient at risk for suicide?: Yes Suicidal Plan?: Yes-Currently Present Specify Current Suicidal Plan: plan to walk out into traffic to be hit and killed Access to Means: Yes What has been your use of drugs/alcohol within the last 12 months?: no alcohol or recreational drugs How many times?: 0 Other Self Harm Risks: none noted Triggers for Past Attempts:  (na) Intentional Self Injurious Behavior:  (none noted) Risk to Others: Homicidal Ideation: Yes-Currently Present Thoughts of Harm to Others: Yes-Currently Present (sts yes, thoughts of harming staff & residents) Current Homicidal Intent: No (denies) Current Homicidal Plan: No Access to Homicidal Means: No (denies) Identified Victim: [facility] staff & residents (made verbal threats at [facility] today) History of harm to others?: No (denies; none noted ) Assessment of Violence: On admission (property damage to [facility]) Violent Behavior Description: na Does patient have access to weapons?: No Criminal Charges Pending?: Yes (sts has charges for damaging property-[facility]) Describe Pending Criminal Charges:  ( ) Does patient have a court date:  (unknown) Prior Inpatient Therapy: Prior Inpatient Therapy: No (denied- 0 in pt record) Prior Therapy Dates: na Prior Therapy Facilty/Provider(s): na Reason for Treatment: na Prior Outpatient Therapy: Prior Outpatient Therapy: Yes Prior Therapy Dates:  (uncertain) Prior Therapy Facilty/Provider(s):  (uncertain-pt could not answer) Reason for Treatment: Schizophrenia; Schizoaffective Does patient have an ACCT team?: Unknown (saw ACTT mention in old pt record but no name given) Does patient have Intensive In-House Services?  : No Does patient have [facility] services? : Unknown  Does patient have P4CC services?: Unknown  Past Medical History:  Past Medical History  Diagnosis Date  . Hypertension   . Diabetes mellitus without complication ([place])   . Seizures ([place])   . Schizophrenia [facility])     Past Surgical History   Procedure Laterality Date  . Appendectomy     Family History: No family history on file.  Family Psychiatric  History: See ED H&P  Social History:  History  Alcohol Use No     History  Drug Use No    Social History   Social History  . Marital Status: Single    Spouse Name: N/A  . Number of Children: N/A  . Years of Education: N/A   Social History Main Topics  . Smoking status: Current Every Day Smoker  . Smokeless tobacco: None  . Alcohol Use: No  . Drug Use: No  . Sexual Activity: Not Asked   Other Topics Concern  . None   Social History Narrative   Additional Social History:  Allergies:  No Known Allergies  Labs:  Results for orders placed or performed during the hospital encounter of 05/09/16 (from the past 48 hour(s))  Basic metabolic panel     Status: Abnormal   Collection Time: 05/09/16  4:05 PM  Result Value Ref Range   Sodium 128 (L) 135 - 145 mmol/L   Potassium 4.2 3.5 - 5.1 mmol/L   Chloride 95 (L) 101 - 111 mmol/L   CO2 27 22 - 32 mmol/L   Glucose, Bld 107 (H) 65 - 99 mg/dL   BUN 11 6 - 20 mg/dL   Creatinine, Ser 0.80 0.61 - 1.24 mg/dL   Calcium 8.7 (L) 8.9 - 10.3 mg/dL   GFR calc non Af Amer >60 >60 mL/min   GFR calc Af Amer >60 >60 mL/min    Comment: (NOTE) The eGFR has been calculated using the CKD EPI equation. This calculation has not been validated in all clinical situations. eGFR's persistently <60 mL/min signify possible Chronic Kidney Disease.    Anion gap 6 5 - 15  Ethanol     Status: None   Collection Time: 05/09/16  4:05 PM  Result Value Ref Range   Alcohol, Ethyl (B) <5 <5 mg/dL    Comment:        LOWEST DETECTABLE LIMIT FOR SERUM ALCOHOL IS 5 mg/dL FOR MEDICAL PURPOSES ONLY   CBC with Differential     Status: Abnormal   Collection Time: 05/09/16  4:05 PM  Result Value Ref Range   WBC 3.6 (L) 4.0 - 10.5 K/uL   RBC 4.04 (L) 4.22 - 5.81 MIL/uL   Hemoglobin 13.2 13.0 - 17.0 g/dL   HCT 37.0 (L) 39.0 - 52.0 %   MCV 91.6  78.0 - 100.0 fL   MCH 32.7 26.0 - 34.0 pg   MCHC 35.7 30.0 - 36.0 g/dL   RDW 12.8 11.5 - 15.5 %   Platelets 140 (L) 150 - 400 K/uL   Neutrophils Relative % 39 %   Neutro Abs 1.4 (L) 1.7 - 7.7 K/uL   Lymphocytes Relative 43 %   Lymphs Abs 1.6 0.7 - 4.0 K/uL   Monocytes Relative 16 %   Monocytes Absolute 0.6 0.1 - 1.0 K/uL   Eosinophils Relative 2 %   Eosinophils Absolute 0.1 0.0 - 0.7 K/uL   Basophils Relative 0 %   Basophils Absolute 0.0 0.0 - 0.1 K/uL  Urine rapid drug screen (hosp performed)     Status: None  Collection Time: 05/09/16  5:57 PM  Result Value Ref Range   Opiates NONE DETECTED NONE DETECTED   Cocaine NONE DETECTED NONE DETECTED   Benzodiazepines NONE DETECTED NONE DETECTED   Amphetamines NONE DETECTED NONE DETECTED   Tetrahydrocannabinol NONE DETECTED NONE DETECTED   Barbiturates NONE DETECTED NONE DETECTED    Comment:        DRUG SCREEN FOR MEDICAL PURPOSES ONLY.  IF CONFIRMATION IS NEEDED FOR ANY PURPOSE, NOTIFY LAB WITHIN 5 DAYS.        LOWEST DETECTABLE LIMITS FOR URINE DRUG SCREEN Drug Class       Cutoff (ng/mL) Amphetamine      1000 Barbiturate      200 Benzodiazepine   616 Tricyclics       073 Opiates          300 Cocaine          300 THC              50     Current Facility-Administered Medications  Medication Dose Route Frequency Provider Last Rate Last Dose  . acetaminophen (TYLENOL) tablet 650 mg  650 mg Oral Q6H PRN Francine Graven, DO      . benztropine (COGENTIN) tablet 1 mg  1 mg Oral BID Francine Graven, DO   1 mg at 05/10/16 0915  . citalopram (CELEXA) tablet 40 mg  40 mg Oral Daily Francine Graven, DO   40 mg at 05/10/16 0916  . clonazePAM (KLONOPIN) tablet 0.5 mg  0.5 mg Oral TID Francine Graven, DO   0.5 mg at 05/10/16 0916  . divalproex (DEPAKOTE ER) 24 hr tablet 1,000 mg  1,000 mg Oral BID Francine Graven, DO   1,000 mg at 05/10/16 0915  . donepezil (ARICEPT) tablet 5 mg  5 mg Oral QHS Francine Graven, DO   5 mg at  05/09/16 2253  . fluticasone (FLONASE) 50 MCG/ACT nasal spray 2 spray  2 spray Each Nare Daily Francine Graven, DO   2 spray at 05/10/16 0920  . gabapentin (NEURONTIN) capsule 300 mg  300 mg Oral BID Francine Graven, DO   300 mg at 05/10/16 7106  . haloperidol (HALDOL) tablet 1 mg  1 mg Oral BID Francine Graven, DO   1 mg at 05/10/16 0915  . hydrocerin (EUCERIN) cream 1 application  1 application Topical BID Francine Graven, DO   1 application at 26/94/85 [phone]  . lactulose (CHRONULAC) 10 GM/15ML solution 10 g  10 g Oral Daily Francine Graven, DO   10 g at 05/10/16 0800  . loratadine (CLARITIN) tablet 10 mg  10 mg Oral Daily Francine Graven, DO   10 mg at 05/10/16 0915  . metFORMIN (GLUCOPHAGE) tablet 500 mg  500 mg Oral BID WC Francine Graven, DO   500 mg at 05/10/16 0741  . methocarbamol (ROBAXIN) tablet 500 mg  500 mg Oral TID Francine Graven, DO   500 mg at 05/10/16 0915  . paliperidone (INVEGA) 24 hr tablet 12 mg  12 mg Oral Daily Francine Graven, DO   12 mg at 05/10/16 1138  . pantoprazole (PROTONIX) EC tablet 40 mg  40 mg Oral Daily Francine Graven, DO   40 mg at 05/10/16 0916  . prazosin (MINIPRESS) capsule 1 mg  1 mg Oral BID Francine Graven, DO   1 mg at 05/10/16 0916  . propranolol (INDERAL) tablet 20 mg  20 mg Oral TID Francine Graven, DO   20 mg at 05/10/16 0916  . sodium chloride (OCEAN)  0.65 % nasal spray 2 spray  2 spray Each Nare TID PRN Francine Graven, DO       Current Outpatient Prescriptions  Medication Sig Dispense Refill  . acetaminophen (TYLENOL) 325 MG tablet Take 650 mg by mouth every 6 (six) hours as needed for moderate pain.    . benztropine (COGENTIN) 1 MG tablet Take 1 mg by mouth 2 (two) times daily.    . cetirizine (ZYRTEC) 10 MG tablet Take 10 mg by mouth daily.    . citalopram (CELEXA) 40 MG tablet Take 40 mg by mouth daily.    . clonazePAM (KLONOPIN) 0.5 MG tablet Take 0.5 mg by mouth 3 (three) times daily. *May take one tablet daily as needed for  anxiety    . divalproex (DEPAKOTE ER) 500 MG 24 hr tablet Take 1,000 mg by mouth 2 (two) times daily.     Marland Kitchen donepezil (ARICEPT) 5 MG tablet Take 5 mg by mouth at bedtime.    . fluticasone (FLONASE) 50 MCG/ACT nasal spray Place 2 sprays into both nostrils daily.    Marland Kitchen gabapentin (NEURONTIN) 300 MG capsule Take 300 mg by mouth 2 (two) times daily.    . haloperidol (HALDOL) 1 MG tablet Take 1 mg by mouth 2 (two) times daily.    Marland Kitchen lactulose (CHRONULAC) 10 GM/15ML solution Take 10 g by mouth daily.    . metFORMIN (GLUCOPHAGE) 500 MG tablet Take 500 mg by mouth 2 (two) times daily with a meal.    . methocarbamol (ROBAXIN) 500 MG tablet Take 500 mg by mouth 3 (three) times daily.    Marland Kitchen omeprazole (PRILOSEC) 20 MG capsule Take 40 mg by mouth 2 (two) times daily.    . paliperidone (INVEGA) 6 MG 24 hr tablet Take 12 mg by mouth daily.    . prazosin (MINIPRESS) 1 MG capsule Take 1 mg by mouth 2 (two) times daily.    . propranolol (INDERAL) 20 MG tablet Take 20 mg by mouth 3 (three) times daily.    . Skin Protectants, Misc. (EUCERIN) cream Apply 1 application topically 2 (two) times daily.    . sodium chloride (OCEAN) 0.65 % SOLN nasal spray Place 2 sprays into both nostrils 3 (three) times daily as needed for congestion.     Psychiatric Specialty Exam: Physical Exam: See EDP assessment  ROS: See EDP assessment  Blood pressure 124/68, pulse 55, temperature 97.7 F (36.5 C), temperature source Oral, resp. rate 18, height 5' 8"  (1.727 m), weight 84.823 kg (187 lb), SpO2 98 %.Body mass index is 28.44 kg/(m^2).  General Appearance: Casual  Eye Contact:  Fair  Speech:  Clear and Coherent and Slow  Volume:  Decreased  Mood:  Upset about his living situation.  Affect:  Restricted  Thought Process:  Goal Directed  Orientation:  Full (Time, Place, and Person)  Thought Content:  Denies any hallucinations, delusional thought or paranoia  Suicidal Thoughts:  Denies  Homicidal Thoughts:  Denies  Memory:   Immediate;   Good Recent;   Good Remote;   Good  Judgement:  Fair  Insight:  Fair  Psychomotor Activity:  Decreased  Concentration:  Concentration: Fair and Attention Span: Fair  Recall:  Good  Fund of Knowledge:  Fair  Language:  Good  Akathisia:  Negative  Handed:  Right  AIMS (if indicated):     Assets:  Communication Skills Desire for Improvement  ADL's:  Intact  Cognition:  WNL  Sleep:      Treatment Plan Summary: -Patient  does not meet criteria for psychiatric inpatient admission. (His need & concern is related to his living arrangement, appears to be tolerating his current medication regimen). -Rescind IVC (if in place)  -Discharge to group home of choice if possible. -Resources given for outpatient psychiatry/counseling services  -SW to call group home to confirm safety plan regarding locking up medications, sharps, and weapons (if any in home). See social work notes.  -patient to resume psychiatric services with his current outpatient provider.  Disposition: Patient does not meet criteria for psychiatric inpatient admission. Supportive therapy provided about ongoing stressors. Discussed crisis plan, support from social network, calling 911, coming to the Emergency Department, and calling Suicide Hotline.  Encarnacion Slates, NP, PMHNP, FNP-BC 05/10/2016 2:15 PM

## 2016-05-10 NOTE — ED Notes (Signed)
[facility] would be [facility] and does not need inpt tx.  Pt does not want to go back to group home and is requesting to go to [facility] in [place].  Social worker contacted by primary nurse.

## 2016-05-10 NOTE — Clinical Social Work Note (Signed)
CSW notified of pt's request for new placement. CSW attempted to reach guardian, but no answer. Discussed with disposition CSW who reports plan is for pt to return to current facility. She plans to share pt's request for a new facility when guardian returns call. ED updated.   Derenda FennelKara Mariena Meares, LCSW [phone]

## 2016-05-10 NOTE — ED Provider Notes (Signed)
Patient has been seen by psychiatry. He does not meet inpatient criteria. He can be discharged back to his group home. He denies any suicidal or homicidal ideation. Social worker contacted as patient has had behavioral problems in the past and desires to have a new group home.  BP 124/68 mmHg  Pulse 55  Temp(Src) 97.7 F (36.5 C) (Oral)  Resp 18  Ht 5\' 8"  (1.727 m)  Wt 187 lb (84.823 kg)  BMI 28.44 kg/m2  SpO2 98%   Glynn OctaveStephen Altha Sweitzer, MD 05/10/16 1520

## 2016-05-10 NOTE — Progress Notes (Signed)
Contacted pt's group home [facility], [phone]- Jeremy Schultz. She states that they brought pt to ED due to his "behaviors worsening over the last 2 weeks." Described behavior as "following one of the other residents around, threatening them. The other resident became afraid." States that behavior is not pt's baseline in their experience. States pt does have hx of expressing SI which, per chart, quickly subsides following ED admission. She states pt has been hospitalized for behavioral health issues in the past but "it has been some time." reports he is typically well-managed outpatient with services from [facility]. Reports the dx they have on file for pt is schizoaffective d/o. States pt last saw [facility] 6 days ago. Reports pt's [facility] is through "[facility] in [place] Reports contact is "Ladene ArtistDerrick" but does not have contact information. CSW attempted to locate this agency with no success. Pt's legal guardian is Jeremy Schultz [phone]. CSW left voicemail requesting returned call. Jeremy Schultz states she is faxing guardianship paperwork to Clinical research associatewriter.  Jeremy. [name] aware pt is awaiting re-evaluation to determine disposition recommendation and can be reached at number above to follow up once pt evaluated.  Jeremy Schultz, MSW, LCSW Clinical Social Work, Disposition  05/10/2016 [phone]  Guardianship paperwork received. Copy retains for pt's chart.

## 2016-05-10 NOTE — Progress Notes (Signed)
Spoke with Damien FusiA. Nwoko, NP who has evaluated pt this morning. After consulting with psychiatrist, recommends pt does not meet criteria for inpatient admission and IVC should be rescinded in order to d/c home to group home. Expressed pt shared he desires to move to group home in [place]. Left [place]ic[place] for [place]'s legal [place], [place]- Purvis SheffieldDarryl Moore [phone], however, currently there is no barrier to d/c home to current group home. Spoke with [facility] Supervisor Lin Landsmanatricia Whitsett [phone]. She is aware of and agreeable to plan to d/c to follow up with current OP providers. Ms. Judithann SheenWhitsett clarifies that pt does not have ACTT services through [facility] but rather a peer support team. States pt's medications and therapy are managed in-house.  Ms. Judithann SheenWhitsett states pt has lived at [facility] for approximately 2 years. States she feels "he does this kind of behavior when he wants to go to the hospital. He likes getting out of the house and the meals and snacks in the hospital. He acts fine until he gets upset about not getting something and then acts up, knows to say that he will kill someone or himself, and starts breaking things once the police come. This seems like learned behavior."  Ms. Judithann SheenWhitsett states that she will contact house transportation to pick pt up from ED this afternoon.  Ilean SkillMeghan Valgene Deloatch, MSW, LCSW Clinical Social Work, Disposition  05/10/2016 [phone]

## 2016-05-10 NOTE — Discharge Instructions (Signed)
Schizophrenia Follow up with your psychiatrist. Return to the ED if you develop new or worsening symptoms. Schizophrenia is a mental illness. It may cause disturbed or disorganized thinking, speech, or behavior. People with schizophrenia have problems functioning in one or more areas of life: work, school, home, or relationships. People with schizophrenia are at increased risk for suicide, certain chronic physical illnesses, and unhealthy behaviors, such as smoking and drug use. People who have family members with schizophrenia are at higher risk of developing the illness. Schizophrenia affects men and women equally but usually appears at an earlier age (teenage or early adult years) in men.  SYMPTOMS The earliest symptoms are often subtle (prodrome) and may go unnoticed until the illness becomes more severe (first-break psychosis). Symptoms of schizophrenia may be continuous or may come and go in severity. Episodes often are triggered by major life events, such as family stress, college, [employer] service, marriage, pregnancy or child birth, divorce, or loss of a loved one. People with schizophrenia may see, hear, or feel things that do not exist (hallucinations). They may have false beliefs in spite of obvious proof to the contrary (delusions). Sometimes speech is incoherent or behavior is odd or withdrawn.  DIAGNOSIS Schizophrenia is diagnosed through an assessment by your caregiver. Your caregiver will ask questions about your thoughts, behavior, mood, and ability to function in daily life. Your caregiver may ask questions about your medical history and use of alcohol or drugs, including prescription medication. Your caregiver may also order blood tests and imaging exams. Certain medical conditions and substances can cause symptoms that resemble schizophrenia. Your caregiver may refer you to a mental health specialist for evaluation. There are three major criterion for a diagnosis of schizophrenia:  Two  or more of the following five symptoms are present for a month or longer:  Delusions. Often the delusions are that you are being attacked, harassed, cheated, persecuted or conspired against (persecutory delusions).  Hallucinations.   Disorganized speech that does not make sense to others.  Grossly disorganized (confused or unfocused) behavior or extremely overactive or underactive motor activity (catatonia).  Negative symptoms such as bland or blunted emotions (flat affect), loss of will power (avolition), and withdrawal from social contacts (social isolation).  Level of functioning in one or more major areas of life (work, school, relationships, or self-care) is markedly below the level of functioning before the onset of illness.   There are continuous signs of illness (either mild symptoms or decreased level of functioning) for at least 6 months or longer. TREATMENT  Schizophrenia is a long-term illness. It is best controlled with continuous treatment rather than treatment only when symptoms occur. The following treatments are used to manage schizophrenia:  Medication--Medication is the most effective and important form of treatment for schizophrenia. Antipsychotic medications are usually prescribed to help manage schizophrenia. Other types of medication may be added to relieve any symptoms that may occur despite the use of antipsychotic medications.  Counseling or talk therapy--Individual, group, or family counseling may be helpful in providing education, support, and guidance. Many people with schizophrenia also benefit from social skills and job skills (vocational) training. A combination of medication and counseling is best for managing the disorder over time. A procedure in which electricity is applied to the brain through the scalp (electroconvulsive therapy) may be used to treat catatonic schizophrenia or schizophrenia in people who cannot take or do not respond to medication and  counseling.   This information is not intended to replace advice given  to you by your health care provider. Make sure you discuss any questions you have with your health care provider.   Document Released: 11/16/2000 Document Revised: 12/10/2014 Document Reviewed: 02/11/2013 Elsevier Interactive Patient Education [employer]

## 2016-05-10 NOTE — ED Notes (Signed)
Pt currently being re evaluated via telepsych

## 2016-06-08 ENCOUNTER — Emergency Department
Admission: EM | Admit: 2016-06-08 | Discharge: 2016-06-08 | Disposition: A | Discharge status: Home / Self Care | Payer: Medicaid Other | Attending: Emergency Medicine | Admitting: Emergency Medicine

## 2016-06-08 DIAGNOSIS — Z8669 Personal history of other diseases of the nervous system and sense organs: Secondary | ICD-10-CM | POA: Diagnosis not present

## 2016-06-08 DIAGNOSIS — F209 Schizophrenia, unspecified: Secondary | ICD-10-CM | POA: Diagnosis not present

## 2016-06-08 DIAGNOSIS — F17200 Nicotine dependence, unspecified, uncomplicated: Secondary | ICD-10-CM | POA: Insufficient documentation

## 2016-06-08 DIAGNOSIS — Z7984 Long term (current) use of oral hypoglycemic drugs: Secondary | ICD-10-CM | POA: Insufficient documentation

## 2016-06-08 DIAGNOSIS — E119 Type 2 diabetes mellitus without complications: Secondary | ICD-10-CM | POA: Insufficient documentation

## 2016-06-08 DIAGNOSIS — I10 Essential (primary) hypertension: Secondary | ICD-10-CM | POA: Insufficient documentation

## 2016-06-08 DIAGNOSIS — R4781 Slurred speech: Secondary | ICD-10-CM | POA: Diagnosis present

## 2016-06-08 DIAGNOSIS — Z79899 Other long term (current) drug therapy: Secondary | ICD-10-CM | POA: Insufficient documentation

## 2016-06-08 LAB — COMPREHENSIVE METABOLIC PANEL
ALT: 19 U/L (ref 17–63)
ANION GAP: 6 (ref 5–15)
AST: 25 U/L (ref 15–41)
Albumin: 3.7 g/dL (ref 3.5–5.0)
Alkaline Phosphatase: 28 U/L — ABNORMAL LOW (ref 38–126)
BUN: 17 mg/dL (ref 6–20)
CALCIUM: 9.4 mg/dL (ref 8.9–10.3)
CHLORIDE: 105 mmol/L (ref 101–111)
CO2: 27 mmol/L (ref 22–32)
Creatinine, Ser: 0.83 mg/dL (ref 0.61–1.24)
Glucose, Bld: 101 mg/dL — ABNORMAL HIGH (ref 65–99)
POTASSIUM: 4.3 mmol/L (ref 3.5–5.1)
Sodium: 138 mmol/L (ref 135–145)
Total Bilirubin: 0.6 mg/dL (ref 0.3–1.2)
Total Protein: 7 g/dL (ref 6.5–8.1)

## 2016-06-08 LAB — ETHANOL: Alcohol, Ethyl (B): <5 mg/dL (ref <5)

## 2016-06-08 LAB — CBC
HCT: 39.4 % — ABNORMAL LOW (ref 40.0–52.0)
Hemoglobin: 13.9 g/dL (ref 13.0–18.0)
MCH: 33.4 pg (ref 26.0–34.0)
MCHC: 35.4 g/dL (ref 32.0–36.0)
MCV: 94.2 fL (ref 80.0–100.0)
Platelets: 133 10*3/uL — ABNORMAL LOW (ref 150–440)
RBC: 4.18 MIL/uL — AB (ref 4.40–5.90)
RDW: 13.8 % (ref 11.5–14.5)
WBC: 5.5 10*3/uL (ref 3.8–10.6)

## 2016-06-08 LAB — SALICYLATE LEVEL

## 2016-06-08 LAB — ACETAMINOPHEN LEVEL

## 2016-06-08 NOTE — ED Notes (Signed)
Pt provided water to drink, advised that he can have soda after breakfast.

## 2016-06-08 NOTE — ED Notes (Signed)
Pt given 2nd warm blanket 

## 2016-06-08 NOTE — ED Notes (Addendum)
Attempted to call [facility], [phone], no answer- twice.

## 2016-06-08 NOTE — ED Notes (Signed)
Transportation has arrived to transfer him back to [facility] group home

## 2016-06-08 NOTE — ED Notes (Signed)
Personal belongings include: white bible (at bedside), navy bible, white teddy bear, jeans, brown slippers, red shirt, black hat

## 2016-06-08 NOTE — ED Notes (Signed)
Called and spoke with Lin LandsmanPatricia Whitsett  [phone] house supervisor at [facility]  Informed her of pt discharge that has been pending  Attempted to call group home number [phone] and it has been busy all am  Elease Hashimotoatricia stated that she will contact her driver and place him on the transportation list for today  - I encouraged her to call back with any problems, updates or an ETA

## 2016-06-08 NOTE — ED Notes (Signed)

## 2016-06-08 NOTE — ED Notes (Signed)
Pt to be discharged back to group home  NAD assessed  No am meds ordered at this time   NKDA  Assessment completed earlier

## 2016-06-08 NOTE — ED Notes (Signed)
Called [facility] a third time, no answer. All voicemail says is "please give us your remote access code" so unable to leave a voicemail as this RN does not have a "remove access code."

## 2016-06-08 NOTE — ED Notes (Signed)
Lunch provided along with an extra drink   

## 2016-06-08 NOTE — Discharge Instructions (Signed)
You have been seen in the Emergency Department (ED) today for a psychiatric complaint.  You have been evaluated and lab work was checked and we believe you are safe to be discharged from the hospital.   You do not meet any criteria for inpatient treatment nor involuntary commitment.  Please return to the ED immediately if you have thoughts of hurting yourself or anyone else, so that we may help you.  Please avoid alcohol and drug use.  Follow up with your doctor and/or therapist as soon as possible regarding today's ED visit.   Please follow up any other recommendations and clinic appointments provided by the psychiatry team that saw you in the Emergency Department.   Schizophrenia Schizophrenia is a mental illness. It may cause disturbed or disorganized thinking, speech, or behavior. People with schizophrenia have problems functioning in one or more areas of life: work, school, home, or relationships. People with schizophrenia are at increased risk for suicide, certain chronic physical illnesses, and unhealthy behaviors, such as smoking and drug use. People who have family members with schizophrenia are at higher risk of developing the illness. Schizophrenia affects men and women equally but usually appears at an earlier age (teenage or early adult years) in men.  SYMPTOMS The earliest symptoms are often subtle (prodrome) and may go unnoticed until the illness becomes more severe (first-break psychosis). Symptoms of schizophrenia may be continuous or may come and go in severity. Episodes often are triggered by major life events, such as family stress, college, [employer] service, marriage, pregnancy or child birth, divorce, or loss of a loved one. People with schizophrenia may see, hear, or feel things that do not exist (hallucinations). They may have false beliefs in spite of obvious proof to the contrary (delusions). Sometimes speech is incoherent or behavior is odd or withdrawn.   DIAGNOSIS Schizophrenia is diagnosed through an assessment by your caregiver. Your caregiver will ask questions about your thoughts, behavior, mood, and ability to function in daily life. Your caregiver may ask questions about your medical history and use of alcohol or drugs, including prescription medication. Your caregiver may also order blood tests and imaging exams. Certain medical conditions and substances can cause symptoms that resemble schizophrenia. Your caregiver may refer you to a mental health specialist for evaluation. There are three major criterion for a diagnosis of schizophrenia:  Two or more of the following five symptoms are present for a month or longer:  Delusions. Often the delusions are that you are being attacked, harassed, cheated, persecuted or conspired against (persecutory delusions).  Hallucinations.   Disorganized speech that does not make sense to others.  Grossly disorganized (confused or unfocused) behavior or extremely overactive or underactive motor activity (catatonia).  Negative symptoms such as bland or blunted emotions (flat affect), loss of will power (avolition), and withdrawal from social contacts (social isolation).  Level of functioning in one or more major areas of life (work, school, relationships, or self-care) is markedly below the level of functioning before the onset of illness.   There are continuous signs of illness (either mild symptoms or decreased level of functioning) for at least 6 months or longer. TREATMENT  Schizophrenia is a long-term illness. It is best controlled with continuous treatment rather than treatment only when symptoms occur. The following treatments are used to manage schizophrenia:  Medication--Medication is the most effective and important form of treatment for schizophrenia. Antipsychotic medications are usually prescribed to help manage schizophrenia. Other types of medication may be added to relieve any  symptoms  that may occur despite the use of antipsychotic medications.  Counseling or talk therapy--Individual, group, or family counseling may be helpful in providing education, support, and guidance. Many people with schizophrenia also benefit from social skills and job skills (vocational) training. A combination of medication and counseling is best for managing the disorder over time. A procedure in which electricity is applied to the brain through the scalp (electroconvulsive therapy) may be used to treat catatonic schizophrenia or schizophrenia in people who cannot take or do not respond to medication and counseling.   This information is not intended to replace advice given to you by your health care provider. Make sure you discuss any questions you have with your health care provider.   Document Released: 11/16/2000 Document Revised: 12/10/2014 Document Reviewed: 02/11/2013 Elsevier Interactive Patient Education [employer]2016 Elsevier Inc.

## 2016-06-08 NOTE — ED Notes (Signed)
BEHAVIORAL HEALTH ROUNDING Patient sleeping: No. Patient alert and oriented: yes Behavior appropriate: Yes.  ; If no, describe:  Nutrition and fluids offered: yes Toileting and hygiene offered: Yes  Sitter present: q15 minute observations and security  monitoring Law enforcement present: Yes  ODS  

## 2016-06-08 NOTE — ED Provider Notes (Addendum)
[facility] Emergency Department Provider Note  ____________________________________________  Time seen: Approximately 6:06 AM  I have reviewed the triage vital signs and the nursing notes.   HISTORY  Chief Complaint Psychiatric Evaluation  Patient apparently has a chronic mental disability.  HPI Jeremy Schultz is a 45 y.o. male with a history of schizophrenia who lives at a group home presents by EMS because, in his words, he had "a bad night".  He states that he does not like where he lives and he does not want to stay at the group home because there is nothing to do.  Although he was evasive in triage when I evaluated him after he had been able to sleep for a few hours he told me that he does not have any intention of hurting himself or anyone else, he just wanted to be away from the group home.  He states that he wants to talk to [name], the person who he identifies as his guardian, about going to a different facility.  I explained to him that we do not put people and different facilities from the emergency department and he states that he knows that, by Okey Regalarol can help him.  He denies hearing or seeing things that he knows are not there.  He denies chest pain, shortness of breath, fever/chills, nausea, vomiting, abdominal pain.  He has had behavioral problems in the past and similar visits to other emergency departments [name].   Past Medical History  Diagnosis Date  . Hypertension   . Diabetes mellitus without complication (HCC)   . Seizures (HCC)   . Schizophrenia [facility](HCC)     Patient Active Problem List   Diagnosis Date Noted  . Schizophrenia (HCC) 09/01/2015  . High blood pressure 09/01/2015  . Seizure disorder (HCC) 09/01/2015    Past Surgical History  Procedure Laterality Date  . Appendectomy      Current Outpatient Rx  Name  Route  Sig  Dispense  Refill  . acetaminophen (TYLENOL) 325 MG tablet   Oral   Take 650 mg by mouth  every 6 (six) hours as needed for moderate pain.         . benztropine (COGENTIN) 1 MG tablet   Oral   Take 1 mg by mouth 2 (two) times daily.         . cetirizine (ZYRTEC) 10 MG tablet   Oral   Take 10 mg by mouth daily.         . citalopram (CELEXA) 40 MG tablet   Oral   Take 40 mg by mouth daily.         . clonazePAM (KLONOPIN) 0.5 MG tablet   Oral   Take 0.5 mg by mouth 3 (three) times daily. *May take one tablet daily as needed for anxiety         . divalproex (DEPAKOTE ER) 500 MG 24 hr tablet   Oral   Take 1,000 mg by mouth 2 (two) times daily.          Marland Kitchen. donepezil (ARICEPT) 5 MG tablet   Oral   Take 5 mg by mouth at bedtime.         . fluticasone (FLONASE) 50 MCG/ACT nasal spray   Each Nare   Place 2 sprays into both nostrils daily.         Marland Kitchen. gabapentin (NEURONTIN) 300 MG capsule   Oral   Take 300 mg by mouth 2 (two) times daily.         .Marland Kitchen  haloperidol (HALDOL) 1 MG tablet   Oral   Take 1 mg by mouth 2 (two) times daily.         Marland Kitchen lactulose (CHRONULAC) 10 GM/15ML solution   Oral   Take 10 g by mouth daily.         . metFORMIN (GLUCOPHAGE) 500 MG tablet   Oral   Take 500 mg by mouth 2 (two) times daily with a meal.         . methocarbamol (ROBAXIN) 500 MG tablet   Oral   Take 500 mg by mouth 3 (three) times daily.         Marland Kitchen omeprazole (PRILOSEC) 20 MG capsule   Oral   Take 40 mg by mouth 2 (two) times daily.         . paliperidone (INVEGA) 6 MG 24 hr tablet   Oral   Take 12 mg by mouth daily.         . prazosin (MINIPRESS) 1 MG capsule   Oral   Take 1 mg by mouth 2 (two) times daily.         . propranolol (INDERAL) 20 MG tablet   Oral   Take 20 mg by mouth 3 (three) times daily.         . Skin Protectants, Misc. (EUCERIN) cream   Topical   Apply 1 application topically 2 (two) times daily.         . sodium chloride (OCEAN) 0.65 % SOLN nasal spray   Each Nare   Place 2 sprays into both nostrils 3 (three)  times daily as needed for congestion.           Allergies Review of patient's allergies indicates no known allergies.  History reviewed. No pertinent family history.  Social History Social History  Substance Use Topics  . Smoking status: Current Every Day Smoker  . Smokeless tobacco: None  . Alcohol Use: No    Review of Systems Constitutional: No fever/chills Eyes: No visual changes. ENT: No sore throat. Cardiovascular: Denies chest pain. Respiratory: Denies shortness of breath. Gastrointestinal: No abdominal pain.  No nausea, no vomiting.  No diarrhea.  No constipation. Genitourinary: Negative for dysuria. Musculoskeletal: Negative for back pain. Skin: Negative for rash. Neurological: Negative for headaches, focal weakness or numbness.  10-point ROS otherwise negative.  ____________________________________________   PHYSICAL EXAM:  VITAL SIGNS: ED Triage Vitals  Enc Vitals Group     BP 06/08/16 0348 124/82 mmHg     Pulse Rate 06/08/16 0348 72     Resp 06/08/16 0348 16     Temp 06/08/16 0348 97.7 F (36.5 C)     Temp Source 06/08/16 0348 Oral     SpO2 06/08/16 0348 94 %     Weight --      Height --      Head Cir --      Peak Flow --      Pain Score 06/08/16 0349 10     Pain Loc --      Pain Edu? --      Excl. in GC? --     Constitutional: Alert and oriented. Chronic slurred speech.  NAD. Eyes: Conjunctivae are normal. PERRL. EOMI. Head: Atraumatic. Nose: No congestion/rhinnorhea. Mouth/Throat: Mucous membranes are moist.  Oropharynx non-erythematous. Neck: No stridor.  No meningeal signs.   Cardiovascular: Normal rate, regular rhythm. Good peripheral circulation. Grossly normal heart sounds.   Respiratory: Normal respiratory effort.  No retractions. Lungs CTAB. Gastrointestinal: Soft and  nontender. No distention.  Musculoskeletal: No lower extremity tenderness nor edema. No gross deformities of extremities. Neurologic:  Normal speech and language.  No gross focal neurologic deficits are appreciated.  Skin:  Skin is warm, dry and intact. No rash noted. Psychiatric: Flat affect.  Communicative.  Denies hallucinations.  Denies SI/HI.  States he had a bad night.  ____________________________________________   LABS (all labs ordered are listed, but only abnormal results are displayed)  Labs Reviewed  COMPREHENSIVE METABOLIC PANEL - Abnormal; Notable for the following:    Glucose, Bld 101 (*)    Alkaline Phosphatase 28 (*)    All other components within normal limits  ACETAMINOPHEN LEVEL - Abnormal; Notable for the following:    Acetaminophen (Tylenol), Serum <10 (*)    All other components within normal limits  CBC - Abnormal; Notable for the following:    RBC 4.18 (*)    HCT 39.4 (*)    Platelets 133 (*)    All other components within normal limits  ETHANOL  SALICYLATE LEVEL  URINE DRUG SCREEN, QUALITATIVE ([facility] ONLY)   ____________________________________________  EKG  None ____________________________________________  RADIOLOGY   No results found.  ____________________________________________   PROCEDURES  Procedure(s) performed:   Procedures   ____________________________________________   INITIAL IMPRESSION / ASSESSMENT AND PLAN / ED COURSE  Pertinent labs & imaging results that were available during my care of the patient were reviewed by me and considered in my medical decision making (see chart for details).  VSS, afebrile, labs unremarkable.  In my opinion the patient does not represent a threat to himself or anyone else.  I told them to continue resting for a while and he stated that he has rested enough and is ready to get up.  I asked him if he is ready to go back to his group home and see if he can get in contact with [name] to tell her how he feels and he said that he is.  I do not believe there is any benefit to having an intake assessment or psychiatry consult as he appears to be at his baseline  and he does not meet any inpatient criteria for admission or involuntary commitment.  I reviewed prior notes by social work, psychiatry, and ED physicians, all of which mentioned that he has learned the behavior of acting out and being evasive about suicidality in order to be taken to the emergency department for evaluation and away from his group home.  He appears calm at this time and does not represent a risk.    I will discharge him back to his group home.   ____________________________________________  FINAL CLINICAL IMPRESSION(S) / ED DIAGNOSES  Final diagnoses:  Schizophrenia, unspecified type (HCC)     MEDICATIONS GIVEN DURING THIS VISIT:  Medications - No data to display   NEW OUTPATIENT MEDICATIONS STARTED DURING THIS VISIT:  New Prescriptions   No medications on file      Note:  This document was prepared using Dragon voice recognition software and may include unintentional dictation errors.   Loleta Roseory Tarius Stangelo, MD 06/08/16 [id number]  Loleta Roseory Nishaan Stanke, MD 06/08/16 [phone]

## 2016-06-08 NOTE — ED Notes (Signed)
ED BHU PLACEMENT JUSTIFICATION Is the patient under IVC or is there intent for IVC:  No   Is the patient medically cleared: Yes.   Is there vacancy in the ED BHU: Yes.   Is the population mix appropriate for patient: Yes.   Is the patient awaiting placement in inpatient or outpatient setting:  Discharged back to group home   Has the patient had a psychiatric consult: Yes.   Survey of unit performed for contraband, proper placement and condition of furniture, tampering with fixtures in bathroom, shower, and each patient room: Yes.  ; Findings:  APPEARANCE/BEHAVIOR Calm and cooperative NEURO ASSESSMENT Orientation: oriented x2  Place self  Denies pain Hallucinations: No.None noted (Hallucinations) Speech: Normal Gait: normal RESPIRATORY ASSESSMENT Even  Unlabored respirations  CARDIOVASCULAR ASSESSMENT Pulses equal   regular rate  Skin warm and dry   GASTROINTESTINAL ASSESSMENT no GI complaint EXTREMITIES Full ROM  PLAN OF CARE Provide calm/safe environment. Vital signs assessed twice daily. ED BHU Assessment once each 12-hour shift. Collaborate with intake RN daily or as condition indicates. Assure the ED provider has rounded once each shift. Provide and encourage hygiene. Provide redirection as needed. Assess for escalating behavior; address immediately and inform ED provider.  Assess family dynamic and appropriateness for visitation as needed: Yes.  ; If necessary, describe findings:  Educate the patient/family about BHU procedures/visitation: Yes.  ; If necessary, describe findings:

## 2016-06-08 NOTE — ED Notes (Signed)
Pt given breakfast tray

## 2016-06-08 NOTE — ED Notes (Signed)
Pt observed lying on the bed  - NAD assessed  Pt is discharged -awaiting [facility] to contact  - PM RN has called and left a message  TTS to be involved

## 2016-06-08 NOTE — ED Notes (Signed)
Pt presents to ED via [facility] from [facility]. Pt states that he does not like living there, has lived there for more than 2 years. Pt is ambulatory to room. Pt is tearful during triage. Pt has slurred speech and is hard to understand. When asked why pt does not like living at [facility], he states "there is nothing to do there." When asked if pt is having any SI feelings, he states "perhaps." Pt states "I don't know" to having a plan. Pt then goes on to tell this RN that he misses his family, states they died. Per EMS pt has been feeling down and wants help.

## 2016-06-12 ENCOUNTER — Ambulatory Visit (INDEPENDENT_AMBULATORY_CARE_PROVIDER_SITE_OTHER): Payer: Medicaid Other | Admitting: Sports Medicine

## 2016-06-12 ENCOUNTER — Encounter: Payer: Self-pay | Admitting: Sports Medicine

## 2016-06-12 DIAGNOSIS — I739 Peripheral vascular disease, unspecified: Secondary | ICD-10-CM | POA: Diagnosis not present

## 2016-06-12 DIAGNOSIS — M79676 Pain in unspecified toe(s): Secondary | ICD-10-CM

## 2016-06-12 DIAGNOSIS — M79609 Pain in unspecified limb: Principal | ICD-10-CM

## 2016-06-12 DIAGNOSIS — E1142 Type 2 diabetes mellitus with diabetic polyneuropathy: Secondary | ICD-10-CM

## 2016-06-12 DIAGNOSIS — B351 Tinea unguium: Secondary | ICD-10-CM | POA: Diagnosis not present

## 2016-06-12 NOTE — Patient Instructions (Signed)
Diabetes and Foot Care Diabetes may cause you to have problems because of poor blood supply (circulation) to your feet and legs. This may cause the skin on your feet to become thinner, break easier, and heal more slowly. Your skin may become dry, and the skin may peel and crack. You may also have nerve damage in your legs and feet causing decreased feeling in them. You may not notice minor injuries to your feet that could lead to infections or more serious problems. Taking care of your feet is one of the most important things you can do for yourself.  HOME CARE INSTRUCTIONS  Wear shoes at all times, even in the house. Do not go barefoot. Bare feet are easily injured.  Check your feet daily for blisters, cuts, and redness. If you cannot see the bottom of your feet, use a mirror or ask someone for help.  Wash your feet with warm water (do not use hot water) and mild soap. Then pat your feet and the areas between your toes until they are completely dry. Do not soak your feet as this can dry your skin.  Apply a moisturizing lotion or petroleum jelly (that does not contain alcohol and is unscented) to the skin on your feet and to dry, brittle toenails. Do not apply lotion between your toes.  Trim your toenails straight across. Do not dig under them or around the cuticle. File the edges of your nails with an emery board or nail file.  Do not cut corns or calluses or try to remove them with medicine.  Wear clean socks or stockings every day. Make sure they are not too tight. Do not wear knee-high stockings since they may decrease blood flow to your legs.  Wear shoes that fit properly and have enough cushioning. To break in new shoes, wear them for just a few hours a day. This prevents you from injuring your feet. Always look in your shoes before you put them on to be sure there are no objects inside.  Do not cross your legs. This may decrease the blood flow to your feet.  If you find a minor scrape,  cut, or break in the skin on your feet, keep it and the skin around it clean and dry. These areas may be cleansed with mild soap and water. Do not cleanse the area with peroxide, alcohol, or iodine.  When you remove an adhesive bandage, be sure not to damage the skin around it.  If you have a wound, look at it several times a day to make sure it is healing.  Do not use heating pads or hot water bottles. They may burn your skin. If you have lost feeling in your feet or legs, you may not know it is happening until it is too late.  Make sure your health care provider performs a complete foot exam at least annually or more often if you have foot problems. Report any cuts, sores, or bruises to your health care provider immediately. SEEK MEDICAL CARE IF:   You have an injury that is not healing.  You have cuts or breaks in the skin.  You have an ingrown nail.  You notice redness on your legs or feet.  You feel burning or tingling in your legs or feet.  You have pain or cramps in your legs and feet.  Your legs or feet are numb.  Your feet always feel cold. SEEK IMMEDIATE MEDICAL CARE IF:   There is increasing redness,   swelling, or pain in or around a wound.  There is a red line that goes up your leg.  Pus is coming from a wound.  You develop a fever or as directed by your health care provider.  You notice a bad smell coming from an ulcer or wound.   This information is not intended to replace advice given to you by your health care provider. Make sure you discuss any questions you have with your health care provider.   Document Released: 11/16/2000 Document Revised: 07/22/2013 Document Reviewed: 04/28/2013 Elsevier Interactive Patient Education 2016 [employer] Inc.  

## 2016-06-12 NOTE — Progress Notes (Signed)
Patient ID: Jeremy Schultz, male   DOB: 11-22-1971, 45 y.o.   MRN: [id number]   Subjective: Jeremy Schultz is a 45 y.o. male patient with history of diabetes who presents to office today complaining of long, painful nails  while ambulating in shoes; unable to trim. Patient states that the glucose reading this morning was not recorded; keeps records at living facility. Patient denies any new changes in medication or new problems. Patient denies any new cramping, numbness, burning or tingling in the legs. Admits that his toes hurt sometimes, need shoes.   Patient is from [facility] facility and is assisted by care giver.   Patient Active Problem List   Diagnosis Date Noted  . Schizophrenia ([place]) 09/01/2015  . High blood pressure 09/01/2015  . Seizure disorder ([place]) 09/01/2015   Current Outpatient Prescriptions on File Prior to Visit  Medication Sig Dispense Refill  . acetaminophen (TYLENOL) 325 MG tablet Take 650 mg by mouth every 6 (six) hours as needed for moderate pain.    . benztropine (COGENTIN) 1 MG tablet Take 1 mg by mouth 2 (two) times daily.    . cetirizine (ZYRTEC) 10 MG tablet Take 10 mg by mouth daily.    . citalopram (CELEXA) 40 MG tablet Take 40 mg by mouth daily.    . clonazePAM (KLONOPIN) 0.5 MG tablet Take 0.5 mg by mouth 3 (three) times daily. *May take one tablet daily as needed for anxiety    . divalproex (DEPAKOTE ER) 500 MG 24 hr tablet Take 1,000 mg by mouth 2 (two) times daily.     Marland Kitchen donepezil (ARICEPT) 5 MG tablet Take 5 mg by mouth at bedtime.    . fluticasone (FLONASE) 50 MCG/ACT nasal spray Place 2 sprays into both nostrils daily.    Marland Kitchen gabapentin (NEURONTIN) 300 MG capsule Take 300 mg by mouth 2 (two) times daily.    . haloperidol (HALDOL) 1 MG tablet Take 1 mg by mouth 2 (two) times daily.    Marland Kitchen lactulose (CHRONULAC) 10 GM/15ML solution Take 10 g by mouth daily.    . metFORMIN (GLUCOPHAGE) 500 MG tablet Take 500 mg by mouth 2 (two) times daily with a meal.     . methocarbamol (ROBAXIN) 500 MG tablet Take 500 mg by mouth 3 (three) times daily.    Marland Kitchen omeprazole (PRILOSEC) 20 MG capsule Take 40 mg by mouth 2 (two) times daily.    . paliperidone (INVEGA) 6 MG 24 hr tablet Take 12 mg by mouth daily.    . prazosin (MINIPRESS) 1 MG capsule Take 1 mg by mouth 2 (two) times daily.    . propranolol (INDERAL) 20 MG tablet Take 20 mg by mouth 3 (three) times daily.    . Skin Protectants, Misc. (EUCERIN) cream Apply 1 application topically 2 (two) times daily.    . sodium chloride (OCEAN) 0.65 % SOLN nasal spray Place 2 sprays into both nostrils 3 (three) times daily as needed for congestion.     No current facility-administered medications on file prior to visit.   No Known Allergies  Recent Results (from the past 2160 hour(s))  Basic metabolic panel     Status: Abnormal   Collection Time: 05/09/16  4:05 PM  Result Value Ref Range   Sodium 128 (L) 135 - 145 mmol/L   Potassium 4.2 3.5 - 5.1 mmol/L   Chloride 95 (L) 101 - 111 mmol/L   CO2 27 22 - 32 mmol/L   Glucose, Bld 107 (H) 65 -  99 mg/dL   BUN 11 6 - 20 mg/dL   Creatinine, Ser 0.80 0.61 - 1.24 mg/dL   Calcium 8.7 (L) 8.9 - 10.3 mg/dL   GFR calc non Af Amer >60 >60 mL/min   GFR calc Af Amer >60 >60 mL/min    Comment: (NOTE) The eGFR has been calculated using the CKD EPI equation. This calculation has not been validated in all clinical situations. eGFR's persistently <60 mL/min signify possible Chronic Kidney Disease.    Anion gap 6 5 - 15  Ethanol     Status: None   Collection Time: 05/09/16  4:05 PM  Result Value Ref Range   Alcohol, Ethyl (B) <5 <5 mg/dL    Comment:        LOWEST DETECTABLE LIMIT FOR SERUM ALCOHOL IS 5 mg/dL FOR MEDICAL PURPOSES ONLY   CBC with Differential     Status: Abnormal   Collection Time: 05/09/16  4:05 PM  Result Value Ref Range   WBC 3.6 (L) 4.0 - 10.5 K/uL   RBC 4.04 (L) 4.22 - 5.81 MIL/uL   Hemoglobin 13.2 13.0 - 17.0 g/dL   HCT 37.0 (L) 39.0 - 52.0 %    MCV 91.6 78.0 - 100.0 fL   MCH 32.7 26.0 - 34.0 pg   MCHC 35.7 30.0 - 36.0 g/dL   RDW 12.8 11.5 - 15.5 %   Platelets 140 (L) 150 - 400 K/uL   Neutrophils Relative % 39 %   Neutro Abs 1.4 (L) 1.7 - 7.7 K/uL   Lymphocytes Relative 43 %   Lymphs Abs 1.6 0.7 - 4.0 K/uL   Monocytes Relative 16 %   Monocytes Absolute 0.6 0.1 - 1.0 K/uL   Eosinophils Relative 2 %   Eosinophils Absolute 0.1 0.0 - 0.7 K/uL   Basophils Relative 0 %   Basophils Absolute 0.0 0.0 - 0.1 K/uL  Urine rapid drug screen (hosp performed)     Status: None   Collection Time: 05/09/16  5:57 PM  Result Value Ref Range   Opiates NONE DETECTED NONE DETECTED   Cocaine NONE DETECTED NONE DETECTED   Benzodiazepines NONE DETECTED NONE DETECTED   Amphetamines NONE DETECTED NONE DETECTED   Tetrahydrocannabinol NONE DETECTED NONE DETECTED   Barbiturates NONE DETECTED NONE DETECTED    Comment:        DRUG SCREEN FOR MEDICAL PURPOSES ONLY.  IF CONFIRMATION IS NEEDED FOR ANY PURPOSE, NOTIFY LAB WITHIN 5 DAYS.        LOWEST DETECTABLE LIMITS FOR URINE DRUG SCREEN Drug Class       Cutoff (ng/mL) Amphetamine      1000 Barbiturate      200 Benzodiazepine   982 Tricyclics       641 Opiates          300 Cocaine          300 THC              50   Comprehensive metabolic panel     Status: Abnormal   Collection Time: 06/08/16  4:03 AM  Result Value Ref Range   Sodium 138 135 - 145 mmol/L   Potassium 4.3 3.5 - 5.1 mmol/L   Chloride 105 101 - 111 mmol/L   CO2 27 22 - 32 mmol/L   Glucose, Bld 101 (H) 65 - 99 mg/dL   BUN 17 6 - 20 mg/dL   Creatinine, Ser 0.83 0.61 - 1.24 mg/dL   Calcium 9.4 8.9 - 10.3 mg/dL   Total  Protein 7.0 6.5 - 8.1 g/dL   Albumin 3.7 3.5 - 5.0 g/dL   AST 25 15 - 41 U/L   ALT 19 17 - 63 U/L   Alkaline Phosphatase 28 (L) 38 - 126 U/L   Total Bilirubin 0.6 0.3 - 1.2 mg/dL   GFR calc non Af Amer >60 >60 mL/min   GFR calc Af Amer >60 >60 mL/min    Comment: (NOTE) The eGFR has been calculated using  the CKD EPI equation. This calculation has not been validated in all clinical situations. eGFR's persistently <60 mL/min signify possible Chronic Kidney Disease.    Anion gap 6 5 - 15  Ethanol     Status: None   Collection Time: 06/08/16  4:03 AM  Result Value Ref Range   Alcohol, Ethyl (B) <5 <5 mg/dL    Comment:        LOWEST DETECTABLE LIMIT FOR SERUM ALCOHOL IS 5 mg/dL FOR MEDICAL PURPOSES ONLY   Salicylate level     Status: None   Collection Time: 06/08/16  4:03 AM  Result Value Ref Range   Salicylate Lvl <8.7 2.8 - 30.0 mg/dL  Acetaminophen level     Status: Abnormal   Collection Time: 06/08/16  4:03 AM  Result Value Ref Range   Acetaminophen (Tylenol), Serum <10 (L) 10 - 30 ug/mL    Comment:        THERAPEUTIC CONCENTRATIONS VARY SIGNIFICANTLY. A RANGE OF 10-30 ug/mL MAY BE AN EFFECTIVE CONCENTRATION FOR MANY PATIENTS. HOWEVER, SOME ARE BEST TREATED AT CONCENTRATIONS OUTSIDE THIS RANGE. ACETAMINOPHEN CONCENTRATIONS >150 ug/mL AT 4 HOURS AFTER INGESTION AND >50 ug/mL AT 12 HOURS AFTER INGESTION ARE OFTEN ASSOCIATED WITH TOXIC REACTIONS.   cbc     Status: Abnormal   Collection Time: 06/08/16  4:03 AM  Result Value Ref Range   WBC 5.5 3.8 - 10.6 K/uL   RBC 4.18 (L) 4.40 - 5.90 MIL/uL   Hemoglobin 13.9 13.0 - 18.0 g/dL   HCT 39.4 (L) 40.0 - 52.0 %   MCV 94.2 80.0 - 100.0 fL   MCH 33.4 26.0 - 34.0 pg   MCHC 35.4 32.0 - 36.0 g/dL   RDW 13.8 11.5 - 14.5 %   Platelets 133 (L) 150 - 440 K/uL    Objective: General: Patient is awake, alert, and oriented x 3 and in no acute distress.  Integument: Skin is warm, dry and supple bilateral. Nails are tender, long, thickened and  dystrophic with subungual debris, consistent with onychomycosis, 1-5 bilateral. No signs of infection. No open lesions or preulcerative lesions present bilateral. Remaining integument unremarkable.  Vasculature:  Dorsalis Pedis pulse 1/4 bilateral. Posterior Tibial pulse  1/4 bilateral.   Capillary fill time <3 sec 1-5 bilateral. Positive hair growth to the level of the digits. Temperature gradient within normal limits. No varicosities present bilateral. No edema present bilateral.   Neurology: The patient has intact sensation measured with a 5.07/10g Semmes Weinstein Monofilament at all pedal sites bilateral . Vibratory sensation diminished bilateral with tuning fork. No Babinski sign present bilateral.   Musculoskeletal: Asymptomatic hammertoe pedal deformities noted bilateral. Muscular strength 5/5 in all lower extremity muscular groups bilateral without pain on range of motion . No tenderness with calf compression bilateral.  Assessment and Plan: Problem List Items Addressed This Visit    None    Visit Diagnoses    Pain due to onychomycosis of nail    -  Primary    Diabetes mellitus without complication ([place])           -  Examined patient. -Discussed and educated patient on diabetic foot care, especially with  regards to the vascular, neurological and musculoskeletal systems.  -Stressed the importance of good glycemic control and the detriment of not  controlling glucose levels in relation to the foot. -Mechanically debrided all nails 1-5 bilateral using sterile nail nipper and filed with dremel without incident  -Rx given for diabetic shoes and inserts [place] all patient questions -Patient to return  in 3 months for at risk foot care -Patient advised to call the office if any problems or questions arise in the meantime.  Landis Martins, DPM

## 2016-06-13 ENCOUNTER — Emergency Department
Admission: EM | Admit: 2016-06-13 | Discharge: 2016-06-14 | Disposition: A | Discharge status: Home / Self Care | Payer: Medicaid Other | Attending: Emergency Medicine | Admitting: Emergency Medicine

## 2016-06-13 DIAGNOSIS — F209 Schizophrenia, unspecified: Secondary | ICD-10-CM | POA: Insufficient documentation

## 2016-06-13 DIAGNOSIS — F17200 Nicotine dependence, unspecified, uncomplicated: Secondary | ICD-10-CM | POA: Diagnosis not present

## 2016-06-13 DIAGNOSIS — Z79899 Other long term (current) drug therapy: Secondary | ICD-10-CM | POA: Diagnosis not present

## 2016-06-13 DIAGNOSIS — Z7984 Long term (current) use of oral hypoglycemic drugs: Secondary | ICD-10-CM | POA: Insufficient documentation

## 2016-06-13 DIAGNOSIS — E119 Type 2 diabetes mellitus without complications: Secondary | ICD-10-CM | POA: Diagnosis not present

## 2016-06-13 DIAGNOSIS — I10 Essential (primary) hypertension: Secondary | ICD-10-CM | POA: Diagnosis not present

## 2016-06-13 DIAGNOSIS — Z046 Encounter for general psychiatric examination, requested by authority: Secondary | ICD-10-CM | POA: Diagnosis present

## 2016-06-13 LAB — BASIC METABOLIC PANEL
Anion gap: 7 (ref 5–15)
BUN: 11 mg/dL (ref 6–20)
CHLORIDE: 101 mmol/L (ref 101–111)
CO2: 27 mmol/L (ref 22–32)
CREATININE: 0.8 mg/dL (ref 0.61–1.24)
Calcium: 9 mg/dL (ref 8.9–10.3)
GFR calc Af Amer: >60 mL/min (ref >60)
GFR calc non Af Amer: >60 mL/min (ref >60)
Glucose, Bld: 110 mg/dL — ABNORMAL HIGH (ref 65–99)
Potassium: 3.8 mmol/L (ref 3.5–5.1)
SODIUM: 135 mmol/L (ref 135–145)

## 2016-06-13 LAB — CBC WITH DIFFERENTIAL/PLATELET
Basophils Absolute: 0.1 10*3/uL (ref 0–0.1)
Basophils Relative: 2 %
EOS ABS: 0.2 10*3/uL (ref 0–0.7)
EOS PCT: 3 %
HCT: 36 % — ABNORMAL LOW (ref 40.0–52.0)
HEMOGLOBIN: 12.8 g/dL — AB (ref 13.0–18.0)
LYMPHS ABS: 2.1 10*3/uL (ref 1.0–3.6)
Lymphocytes Relative: 38 %
MCH: 33 pg (ref 26.0–34.0)
MCHC: 35.5 g/dL (ref 32.0–36.0)
MCV: 93 fL (ref 80.0–100.0)
MONO ABS: 0.5 10*3/uL (ref 0.2–1.0)
MONOS PCT: 9 %
NEUTROS PCT: 48 %
Neutro Abs: 2.6 10*3/uL (ref 1.4–6.5)
Platelets: 125 10*3/uL — ABNORMAL LOW (ref 150–440)
RBC: 3.87 MIL/uL — ABNORMAL LOW (ref 4.40–5.90)
RDW: 13.8 % (ref 11.5–14.5)
WBC: 5.5 10*3/uL (ref 3.8–10.6)

## 2016-06-13 LAB — ETHANOL: Alcohol, Ethyl (B): <5 mg/dL (ref <5)

## 2016-06-13 LAB — GLUCOSE, CAPILLARY: Glucose-Capillary: 123 mg/dL — ABNORMAL HIGH (ref 65–99)

## 2016-06-13 NOTE — ED Notes (Signed)
This RN asked why pt called EMS, pt stated "I don't like where I'm at, I want to go back to this other home I was at".

## 2016-06-13 NOTE — ED Notes (Addendum)
Pt bib EMS w/ c/o SI ideation from [facility] group home.  Per EMS, pt hit self on head w/ chair at group home and said he wanted to kill himself.  EMS sts that pts roommate at group home was sent here today for similar issues.  When asked by EMS "what this ride" was for, pt stated "I don't like where I'm at".  Pt denies ETHO/drug use.  Pt alert, oriented to self and situation.

## 2016-06-13 NOTE — Discharge Instructions (Signed)
Please seek medical attention and help for any thoughts about wanting to harm herself, harm others, any concerning change in behavior, severe depression, inappropriate drug use or any other new or concerning symptoms. ° °Schizophrenia °Schizophrenia is a mental illness. It may cause disturbed or disorganized thinking, speech, or behavior. People with schizophrenia have problems functioning in one or more areas of life: work, school, home, or relationships. People with schizophrenia are at increased risk for suicide, certain chronic physical illnesses, and unhealthy behaviors, such as smoking and drug use. °People who have family members with schizophrenia are at higher risk of developing the illness. Schizophrenia affects men and women equally but usually appears at an earlier age (teenage or early adult years) in men.  °SYMPTOMS °The earliest symptoms are often subtle (prodrome) and may go unnoticed until the illness becomes more severe (first-break psychosis). Symptoms of schizophrenia may be continuous or may come and go in severity. Episodes often are triggered by major life events, such as family stress, college, military service, marriage, pregnancy or child birth, divorce, or loss of a loved one. People with schizophrenia may see, hear, or feel things that do not exist (hallucinations). They may have false beliefs in spite of obvious proof to the contrary (delusions). Sometimes speech is incoherent or behavior is odd or withdrawn.  °DIAGNOSIS °Schizophrenia is diagnosed through an assessment by your caregiver. Your caregiver will ask questions about your thoughts, behavior, mood, and ability to function in daily life. Your caregiver may ask questions about your medical history and use of alcohol or drugs, including prescription medication. Your caregiver may also order blood tests and imaging exams. Certain medical conditions and substances can cause symptoms that resemble schizophrenia. Your caregiver may  refer you to a mental health specialist for evaluation. There are three major criterion for a diagnosis of schizophrenia: °· Two or more of the following five symptoms are present for a month or longer: °¨ Delusions. Often the delusions are that you are being attacked, harassed, cheated, persecuted or conspired against (persecutory delusions). °¨ Hallucinations.   °¨ Disorganized speech that does not make sense to others. °¨ Grossly disorganized (confused or unfocused) behavior or extremely overactive or underactive motor activity (catatonia). °¨ Negative symptoms such as bland or blunted emotions (flat affect), loss of will power (avolition), and withdrawal from social contacts (social isolation). °· Level of functioning in one or more major areas of life (work, school, relationships, or self-care) is markedly below the level of functioning before the onset of illness.   °· There are continuous signs of illness (either mild symptoms or decreased level of functioning) for at least 6 months or longer. °TREATMENT  °Schizophrenia is a long-term illness. It is best controlled with continuous treatment rather than treatment only when symptoms occur. The following treatments are used to manage schizophrenia: °· Medication--Medication is the most effective and important form of treatment for schizophrenia. Antipsychotic medications are usually prescribed to help manage schizophrenia. Other types of medication may be added to relieve any symptoms that may occur despite the use of antipsychotic medications. °· Counseling or talk therapy--Individual, group, or family counseling may be helpful in providing education, support, and guidance. Many people with schizophrenia also benefit from social skills and job skills (vocational) training. °A combination of medication and counseling is best for managing the disorder over time. A procedure in which electricity is applied to the brain through the scalp (electroconvulsive therapy)  may be used to treat catatonic schizophrenia or schizophrenia in people who cannot take or   not respond to medication and counseling. °  °This information is not intended to replace advice given to you by your health care provider. Make sure you discuss any questions you have with your health care provider. °  °Document Released: 11/16/2000 Document Revised: 12/10/2014 Document Reviewed: 02/11/2013 °Elsevier Interactive Patient Education ©2016 [employer] ° °

## 2016-06-13 NOTE — ED Provider Notes (Signed)
[facility] Emergency Department Provider Note    ____________________________________________  Time seen: ~1125  I have reviewed the triage vital signs and the nursing notes.   HISTORY  Chief Complaint Psychiatric Evaluation   History limited by: Not Limited   HPI Jeremy Schultz is a 45 y.o. male with history of schizophrenia who presents to the emergency department via EMS. Per report EMS states that he did make some suicidal ideation comments group home. However when talked to the patient at this point he denies any suicidal ideation. He does state that he is not happy where he is living. He does say that he would like to switch where he is living. Again I asked multiple times and he denied any thoughts of self-harm. Per chart review he was seen in the emergency department less than one week ago for similar complaint of not liking where he is living. The patient denies any chest pain, shortness of breath, fevers.   Past Medical History  Diagnosis Date  . Hypertension   . Diabetes mellitus without complication (HCC)   . Seizures ([facility])   . Schizophrenia [facility])     Patient Active Problem List   Diagnosis Date Noted  . Schizophrenia ([facility]) 09/01/2015  . High blood pressure 09/01/2015  . Seizure disorder ([facility]) 09/01/2015    Past Surgical History  Procedure Laterality Date  . Appendectomy      Current Outpatient Rx  Name  Route  Sig  Dispense  Refill  . benztropine (COGENTIN) 1 MG tablet   Oral   Take 1 mg by mouth 2 (two) times daily.         . diphenhydrAMINE (BENADRYL) 25 MG tablet   Oral   Take 25 mg by mouth at bedtime.         . gabapentin (NEURONTIN) 300 MG capsule   Oral   Take 300 mg by mouth 2 (two) times daily.         Marland Kitchen LORazepam (ATIVAN) 1 MG tablet   Oral   Take 1 mg by mouth every 6 (six) hours as needed for anxiety.         . metFORMIN (GLUCOPHAGE) 500 MG tablet   Oral   Take 500 mg by mouth 2 (two) times daily  with a meal.         . methocarbamol (ROBAXIN) 500 MG tablet   Oral   Take 500 mg by mouth 3 (three) times daily.         Marland Kitchen omeprazole (PRILOSEC) 20 MG capsule   Oral   Take 40 mg by mouth 2 (two) times daily.         . prazosin (MINIPRESS) 1 MG capsule   Oral   Take 1 mg by mouth 2 (two) times daily.         . propranolol (INDERAL) 20 MG tablet   Oral   Take 20 mg by mouth 3 (three) times daily.         . Skin Protectants, Misc. (EUCERIN) cream   Topical   Apply 1 application topically 2 (two) times daily.           Allergies Review of patient's allergies indicates no known allergies.  No family history on file.  Social History Social History  Substance Use Topics  . Smoking status: Current Every Day Smoker  . Smokeless tobacco: None  . Alcohol Use: No    Review of Systems  Constitutional: Negative for fever. Cardiovascular: Negative for  chest pain. Respiratory: Negative for shortness of breath. Gastrointestinal: Negative for abdominal pain, vomiting and diarrhea. Neurological: Negative for headaches, focal weakness or numbness.  10-point ROS otherwise negative.  ____________________________________________   PHYSICAL EXAM:  VITAL SIGNS: ED Triage Vitals  Enc Vitals Group     BP 06/13/16 2204 112/70 mmHg     Pulse Rate 06/13/16 2204 77     Resp 06/13/16 2204 16     Temp 06/13/16 2204 97.8 F (36.6 C)     Temp Source 06/13/16 2204 Oral     SpO2 06/13/16 2204 96 %     Weight 06/13/16 2204 187 lb (84.823 kg)     Height --      Head Cir --      Peak Flow --      Pain Score 06/13/16 2204 0   Constitutional: Alert and oriented. Well appearing and in no distress. Eyes: Conjunctivae are normal. PERRL. Normal extraocular movements. ENT   Head: Normocephalic and atraumatic.   Nose: No congestion/rhinnorhea.   Mouth/Throat: Mucous membranes are moist.   Neck: No stridor. Hematological/Lymphatic/Immunilogical: No cervical  lymphadenopathy. Cardiovascular: Normal rate, regular rhythm.  No murmurs, rubs, or gallops. Respiratory: Normal respiratory effort without tachypnea nor retractions. Breath sounds are clear and equal bilaterally. No wheezes/rales/rhonchi. Gastrointestinal: Soft and nontender. No distention.  Genitourinary: Deferred Musculoskeletal: Normal range of motion in all extremities. No joint effusions.  Neurologic:  Normal speech and language. No gross focal neurologic deficits are appreciated.  Skin:  Skin is warm, dry and intact. No rash noted. Psychiatric: Mood and affect are normal. Denies any suicidal ideation or thoughts of self harm.  ____________________________________________    LABS (pertinent positives/negatives)  Labs Reviewed  GLUCOSE, CAPILLARY - Abnormal; Notable for the following:    Glucose-Capillary 123 (*)    All other components within normal limits  CBC WITH DIFFERENTIAL/PLATELET - Abnormal; Notable for the following:    RBC 3.87 (*)    Hemoglobin 12.8 (*)    HCT 36.0 (*)    Platelets 125 (*)    All other components within normal limits  BASIC METABOLIC PANEL - Abnormal; Notable for the following:    Glucose, Bld 110 (*)    All other components within normal limits  ETHANOL  URINE DRUG SCREEN, QUALITATIVE ([facility] ONLY)     ____________________________________________   EKG  None  ____________________________________________    RADIOLOGY  None  ____________________________________________   PROCEDURES  Procedure(s) performed: None  Critical Care performed: No  ____________________________________________   INITIAL IMPRESSION / ASSESSMENT AND PLAN / ED COURSE  Pertinent labs & imaging results that were available during my care of the patient were reviewed by me and considered in my medical decision making (see chart for details).  Patient presents to the emergency department today with primary complaint of not liking where he is living. There  was report of suicidal ideation however patient denies this at this time. Patient had a similar emergency department visit roughly 1 week ago. This point I do not think patient is a danger to himself. Explained to the patient again that this is not the appropriate place to try to switch living facilities. Will discharge back to current living facility. Patient was comfortable with this plan at this time.  ____________________________________________   FINAL CLINICAL IMPRESSION(S) / ED DIAGNOSES  Final diagnoses:  Schizophrenia, unspecified type (HCC)     Note: This dictation was prepared with Dragon dictation. Any transcriptional errors that result from this process are unintentional  Juelz Whittenberg GoPhineas Semenodman, MD 06/13/16 [id number]

## 2016-06-14 NOTE — BH Assessment (Signed)
Assessment Note  Jeremy Schultz is an 45 y.o. male, with a history of schizophrenia, presenting to the ED after making suicidal comments at his group home.  While in the ED, patient denied being suicidal and states that he just wants to go to a different group home.  Patient was seen a week ago in the ED for the same.   Diagnosis: Schizophrenia  Past Medical History:  Past Medical History  Diagnosis Date  . Hypertension   . Diabetes mellitus without complication (HCC)   . Seizures (HCC)   . Schizophrenia Jeremy Schultz(HCC)     Past Surgical History  Procedure Laterality Date  . Appendectomy      Family History: No family history on file.  Social History:  reports that he has been smoking.  He does not have any smokeless tobacco history on file. He reports that he does not drink alcohol or use illicit drugs.  Additional Social History:  Alcohol / Drug Use History of alcohol / drug use?: No history of alcohol / drug abuse  CIWA: CIWA-Ar BP: 110/70 mmHg Pulse Rate: 70 COWS:    Allergies: No Known Allergies  Home Medications:  (Not in a Schultz admission)  OB/GYN Status:  No LMP for male patient.  General Assessment Data [facility] Assessment: In system Is this a Tele or Face-to-Face Assessment?: Face-to-Face Is this an Initial Assessment or a Re-assessment for this encounter?: Initial Assessment Marital status: Single Maiden name: na Is patient pregnant?: No Pregnancy Status: No Living Arrangements: Group Home ([facility] [phone]) Can pt return to current living arrangement?: Yes Admission Status: Voluntary Is patient capable of signing voluntary admission?: Yes Referral Source: Self/Family/Friend Insurance type: Medicaid  Medical Screening Exam [facility] Walk[facility] ONLY) Medical Exam completed: Yes  Crisis Care Plan Living Arrangements: [facility] [phone]) Legal Guardian: Other: Chari Manning([place] [employer]  [phone]) Name of Psychiatrist: none Name of Therapist: none  Education Status Is patient currently in school?: No Current Grade: na Highest grade of school patient has completed:  (uncertain) Name of school: [employer] Contact person: na  Risk to self with the past 6 months Suicidal Ideation: No Has patient been a risk to self within the past 6 months prior to admission? : Yes Suicidal Intent: No Has patient had any suicidal intent within the past 6 months prior to admission? : No Is patient at risk for suicide?: No Suicidal Plan?: No Has patient had any suicidal plan within the past 6 months prior to admission? : Yes Access to Means: No What has been your use of drugs/alcohol within the last 12 months?: None reported Previous Attempts/Gestures: No How many times?: 0 Other Self Harm Risks: None reported Triggers for Past Attempts: None known (na) Intentional Self Injurious Behavior: None Family Suicide History: No Recent stressful life event(s): Conflict (Comment) (conflict at group home) Persecutory voices/beliefs?: No Depression: No Substance abuse history and/or treatment for substance abuse?: No Suicide prevention information given to non-admitted patients: Not applicable  Risk to Others within the past 6 months Homicidal Ideation: No Does patient have any lifetime risk of violence toward others beyond the six months prior to admission? : No (property damage only; verbal threats to harm others) Thoughts of Harm to Others: No Current Homicidal Intent: No Current Homicidal Plan: No Access to Homicidal Means: No Identified Victim: none identified History of harm to others?: No Assessment of Violence: None Noted Violent Behavior Description: na Does patient have access to  weapons?: No Criminal Charges Pending?: No Describe Pending Criminal Charges: none Does patient have a court date: No Is patient on probation?: No  Psychosis Hallucinations: None  noted Delusions: None noted  Mental Status Report Appearance/Hygiene: Unremarkable Eye Contact: Good Motor Activity: Freedom of movement, Unremarkable Speech: Slow, Logical/coherent, Rapid, Pressured, Slurred Level of Consciousness: Alert Mood: Preoccupied Affect: Angry, Depressed, Flat, Preoccupied, Irritable Anxiety Level: None Thought Processes: Coherent, Relevant, Circumstantial Judgement: Unimpaired Orientation: Person, Place, Time, Situation Obsessive Compulsive Thoughts/Behaviors: None  Cognitive Functioning Concentration: Fair Memory: Recent Intact, Remote Intact Insight: Poor Impulse Control: Poor Appetite: Good Weight Loss: 0 Weight Gain: 0 Sleep: No Change Total Hours of Sleep: 4 Vegetative Symptoms: None  ADLScreening [facility] Assessment Services) Patient's cognitive ability adequate to safely complete daily activities?: Yes Patient able to express need for assistance with ADLs?: Yes Independently performs ADLs?: Yes (appropriate for developmental age)  Prior Inpatient Therapy Prior Inpatient Therapy: No (denied- 0 in pt record) Prior Therapy Dates: na Prior Therapy Facilty/Provider(s): na Reason for Treatment: na  Prior Outpatient Therapy Prior Outpatient Therapy: Yes Prior Therapy Dates: current (uncertain) Prior Therapy Facilty/Provider(s): na (uncertain-pt could not answer) Reason for Treatment: Schizophrenia; Schizoaffective Does patient have an ACCT team?: No Does patient have Intensive In-House Services?  : No Does patient have [facility] services? : No Does patient have P4CC services?: No  ADL Screening (condition at time of admission) Patient's cognitive ability adequate to safely complete daily activities?: Yes Patient able to express need for assistance with ADLs?: Yes Independently performs ADLs?: Yes (appropriate for developmental age)       Abuse/Neglect Assessment (Assessment to be complete while patient is alone) Physical Abuse:  Denies Verbal Abuse: Denies Sexual Abuse: Denies Exploitation of patient/patient's resources: Denies Self-Neglect: Denies Values / Beliefs Cultural Requests During Hospitalization: None Spiritual Requests During Hospitalization: None Consults Spiritual Care Consult Needed: No Social Work Consult Needed: No Merchant navy officer (For Healthcare) Does patient have an advance directive?: No Would patient like information on creating an advanced directive?: No - patient declined information    Additional Information 1:1 In Past 12 Months?: No CIRT Risk: No (possibly) Elopement Risk: No (per hx) Does patient have medical clearance?: Yes     Disposition:  Disposition Initial Assessment Completed for this Encounter: Yes Disposition of Patient: Other dispositions (Pending review w [facility] Extender) Other disposition(s): Other (Comment) (To be discharged back to group home)  On Site Evaluation by:   Reviewed with Physician:    [place] 06/14/2016 12:50 AM

## 2016-06-14 NOTE — ED Notes (Signed)
Spoke with group home owner Barbie BannerDavid Humphrey at [phone] and he will be here at 0700 to pick up patient.

## 2016-06-26 ENCOUNTER — Encounter: Payer: Self-pay | Admitting: *Deleted

## 2016-06-26 ENCOUNTER — Emergency Department
Admission: EM | Admit: 2016-06-26 | Discharge: 2016-06-27 | Disposition: A | Discharge status: Home / Self Care | Payer: Medicaid Other | Attending: Emergency Medicine | Admitting: Emergency Medicine

## 2016-06-26 DIAGNOSIS — G40909 Epilepsy, unspecified, not intractable, without status epilepticus: Secondary | ICD-10-CM

## 2016-06-26 DIAGNOSIS — Z79899 Other long term (current) drug therapy: Secondary | ICD-10-CM | POA: Diagnosis not present

## 2016-06-26 DIAGNOSIS — I10 Essential (primary) hypertension: Secondary | ICD-10-CM | POA: Insufficient documentation

## 2016-06-26 DIAGNOSIS — F203 Undifferentiated schizophrenia: Secondary | ICD-10-CM | POA: Diagnosis not present

## 2016-06-26 DIAGNOSIS — F319 Bipolar disorder, unspecified: Secondary | ICD-10-CM | POA: Insufficient documentation

## 2016-06-26 DIAGNOSIS — F17200 Nicotine dependence, unspecified, uncomplicated: Secondary | ICD-10-CM | POA: Diagnosis not present

## 2016-06-26 DIAGNOSIS — R45851 Suicidal ideations: Secondary | ICD-10-CM | POA: Insufficient documentation

## 2016-06-26 DIAGNOSIS — E119 Type 2 diabetes mellitus without complications: Secondary | ICD-10-CM | POA: Insufficient documentation

## 2016-06-26 DIAGNOSIS — Z7984 Long term (current) use of oral hypoglycemic drugs: Secondary | ICD-10-CM | POA: Diagnosis not present

## 2016-06-26 DIAGNOSIS — F209 Schizophrenia, unspecified: Secondary | ICD-10-CM | POA: Insufficient documentation

## 2016-06-26 HISTORY — DX: Bipolar disorder, unspecified: F31.9

## 2016-06-26 NOTE — ED Notes (Signed)
Pt ambulated to bathroom with walker assistance.  Gave urine sample.  Sent to lab
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
## 2016-06-27 DIAGNOSIS — F203 Undifferentiated schizophrenia: Secondary | ICD-10-CM

## 2016-06-27 DIAGNOSIS — E119 Type 2 diabetes mellitus without complications: Secondary | ICD-10-CM

## 2016-06-27 LAB — CBC
HEMATOCRIT: 39.3 % — AB (ref 40.0–52.0)
Hemoglobin: 13.6 g/dL (ref 13.0–18.0)
MCH: 32.9 pg (ref 26.0–34.0)
MCHC: 34.5 g/dL (ref 32.0–36.0)
MCV: 95.3 fL (ref 80.0–100.0)
Platelets: 158 10*3/uL (ref 150–440)
RBC: 4.12 MIL/uL — ABNORMAL LOW (ref 4.40–5.90)
RDW: 14.2 % (ref 11.5–14.5)
WBC: 5.6 10*3/uL (ref 3.8–10.6)

## 2016-06-27 LAB — COMPREHENSIVE METABOLIC PANEL
ALBUMIN: 3.5 g/dL (ref 3.5–5.0)
ALK PHOS: 29 U/L — AB (ref 38–126)
ALT: 32 U/L (ref 17–63)
AST: 31 U/L (ref 15–41)
Anion gap: 6 (ref 5–15)
BILIRUBIN TOTAL: 0.6 mg/dL (ref 0.3–1.2)
BUN: 14 mg/dL (ref 6–20)
CALCIUM: 9 mg/dL (ref 8.9–10.3)
CO2: 28 mmol/L (ref 22–32)
CREATININE: 0.91 mg/dL (ref 0.61–1.24)
Chloride: 104 mmol/L (ref 101–111)
GFR calc Af Amer: >60 mL/min (ref >60)
GFR calc non Af Amer: >60 mL/min (ref >60)
GLUCOSE: 105 mg/dL — AB (ref 65–99)
POTASSIUM: 4 mmol/L (ref 3.5–5.1)
Sodium: 138 mmol/L (ref 135–145)
TOTAL PROTEIN: 6.7 g/dL (ref 6.5–8.1)

## 2016-06-27 LAB — URINE DRUG SCREEN, QUALITATIVE (ARMC ONLY)
AMPHETAMINES, UR SCREEN: NOT DETECTED
BENZODIAZEPINE, UR SCRN: POSITIVE — AB
Barbiturates, Ur Screen: NOT DETECTED
Cannabinoid 50 Ng, Ur ~~LOC~~: NOT DETECTED
Cocaine Metabolite,Ur ~~LOC~~: NOT DETECTED
MDMA (Ecstasy)Ur Screen: NOT DETECTED
Methadone Scn, Ur: NOT DETECTED
OPIATE, UR SCREEN: NOT DETECTED
PHENCYCLIDINE (PCP) UR S: NOT DETECTED
Tricyclic, Ur Screen: NOT DETECTED

## 2016-06-27 LAB — ACETAMINOPHEN LEVEL: Acetaminophen (Tylenol), Serum: <10 ug/mL — ABNORMAL LOW (ref 10–30)

## 2016-06-27 LAB — SALICYLATE LEVEL: Salicylate Lvl: <4 mg/dL (ref 2.8–30.0)

## 2016-06-27 LAB — ETHANOL: Alcohol, Ethyl (B): <5 mg/dL (ref <5)

## 2016-06-27 NOTE — ED Notes (Signed)

## 2016-06-27 NOTE — ED Notes (Signed)
Called [place] [facility] and got sent through to a number that no one picks up, I will try again.

## 2016-06-27 NOTE — ED Notes (Signed)
I called [facility] [facility] and they said they are really behind and have about 16 patients in queue to be seen.  I was told by Margaretmary Lombard that it probably would not happen during this shift, as he has 10 patients ahead of him.

## 2016-06-27 NOTE — ED Notes (Signed)
Tried to call [facility] TTS, no answer x 2, will attempt [facility] TTS.

## 2016-06-27 NOTE — ED Notes (Signed)
[facility] contacted. They will pick-up pt in approx. 1 hr.

## 2016-06-27 NOTE — ED Notes (Signed)
MD notified of TTS situation and that patient will not be seen tonight more than likely.

## 2016-06-27 NOTE — ED Notes (Signed)
Tried to call [facility] TTS, no answer x 1.
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
## 2016-06-27 NOTE — Discharge Instructions (Signed)
You have been seen in the Emergency Department (ED)  today for a psychiatric complaint.  You have been evaluated by psychiatry and we believe you are safe to be discharged from the hospital.   ° °Please return to the Emergency Department (ED)  immediately if you have ANY thoughts of hurting yourself or anyone else, so that we may help you. ° °Please avoid alcohol and drug use. ° °Follow up with your doctor and/or therapist as soon as possible regarding today's ED  visit.  ° °You may call crisis hotline for [place] at [phone]. ° °

## 2016-06-27 NOTE — ED Notes (Signed)
MD at bedside. 
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
## 2016-06-27 NOTE — ED Notes (Signed)
I called [facility] at [phone] and left message for a call back to main ER number.

## 2016-06-27 NOTE — ED Provider Notes (Signed)
-----------------------------------------   1:51 PM on 06/27/2016 -----------------------------------------   Blood pressure 123/78, pulse (!) 59, temperature 97.6 F (36.4 C), temperature source Oral, resp. rate 16, height 5\' 10"  (1.778 m), weight 187 lb (84.8 kg), SpO2 96 %.  Patient evaluated by psychiatry and cleared for discharge. Labs reviewed and within normal limits.   Nita Sickle, MD 06/27/16 1351

## 2016-09-12 ENCOUNTER — Emergency Department
Admission: EM | Admit: 2016-09-12 | Discharge: 2016-09-13 | Disposition: A | Discharge status: Home / Self Care | Payer: Medicaid Other | Attending: Student | Admitting: Student

## 2016-09-12 ENCOUNTER — Encounter: Payer: Self-pay | Admitting: Emergency Medicine

## 2016-09-12 DIAGNOSIS — F203 Undifferentiated schizophrenia: Secondary | ICD-10-CM | POA: Diagnosis not present

## 2016-09-12 DIAGNOSIS — F319 Bipolar disorder, unspecified: Secondary | ICD-10-CM | POA: Diagnosis not present

## 2016-09-12 DIAGNOSIS — F329 Major depressive disorder, single episode, unspecified: Secondary | ICD-10-CM

## 2016-09-12 DIAGNOSIS — I10 Essential (primary) hypertension: Secondary | ICD-10-CM | POA: Diagnosis not present

## 2016-09-12 DIAGNOSIS — F17200 Nicotine dependence, unspecified, uncomplicated: Secondary | ICD-10-CM | POA: Insufficient documentation

## 2016-09-12 DIAGNOSIS — Z79899 Other long term (current) drug therapy: Secondary | ICD-10-CM | POA: Diagnosis not present

## 2016-09-12 DIAGNOSIS — F209 Schizophrenia, unspecified: Secondary | ICD-10-CM | POA: Diagnosis present

## 2016-09-12 DIAGNOSIS — R4589 Other symptoms and signs involving emotional state: Secondary | ICD-10-CM

## 2016-09-12 DIAGNOSIS — F32A Depression, unspecified: Secondary | ICD-10-CM

## 2016-09-12 DIAGNOSIS — Z7984 Long term (current) use of oral hypoglycemic drugs: Secondary | ICD-10-CM | POA: Diagnosis not present

## 2016-09-12 DIAGNOSIS — R440 Auditory hallucinations: Secondary | ICD-10-CM | POA: Diagnosis present

## 2016-09-12 DIAGNOSIS — E119 Type 2 diabetes mellitus without complications: Secondary | ICD-10-CM | POA: Insufficient documentation

## 2016-09-12 DIAGNOSIS — G40909 Epilepsy, unspecified, not intractable, without status epilepticus: Secondary | ICD-10-CM

## 2016-09-12 LAB — CBC
HEMATOCRIT: 38.9 % — AB (ref 40.0–52.0)
HEMOGLOBIN: 13.8 g/dL (ref 13.0–18.0)
MCH: 33.4 pg (ref 26.0–34.0)
MCHC: 35.5 g/dL (ref 32.0–36.0)
MCV: 94 fL (ref 80.0–100.0)
Platelets: 139 10*3/uL — ABNORMAL LOW (ref 150–440)
RBC: 4.14 MIL/uL — ABNORMAL LOW (ref 4.40–5.90)
RDW: 13.2 % (ref 11.5–14.5)
WBC: 4.5 10*3/uL (ref 3.8–10.6)

## 2016-09-12 LAB — SALICYLATE LEVEL: Salicylate Lvl: <7 mg/dL (ref 2.8–30.0)

## 2016-09-12 LAB — COMPREHENSIVE METABOLIC PANEL
ALK PHOS: 29 U/L — AB (ref 38–126)
ALT: 28 U/L (ref 17–63)
ANION GAP: 7 (ref 5–15)
AST: 32 U/L (ref 15–41)
Albumin: 3.5 g/dL (ref 3.5–5.0)
BILIRUBIN TOTAL: 0.7 mg/dL (ref 0.3–1.2)
BUN: 11 mg/dL (ref 6–20)
CALCIUM: 9 mg/dL (ref 8.9–10.3)
CO2: 25 mmol/L (ref 22–32)
CREATININE: 0.76 mg/dL (ref 0.61–1.24)
Chloride: 97 mmol/L — ABNORMAL LOW (ref 101–111)
GFR calc Af Amer: >60 mL/min (ref >60)
GFR calc non Af Amer: >60 mL/min (ref >60)
GLUCOSE: 96 mg/dL (ref 65–99)
Potassium: 4.3 mmol/L (ref 3.5–5.1)
SODIUM: 129 mmol/L — AB (ref 135–145)
TOTAL PROTEIN: 6.9 g/dL (ref 6.5–8.1)

## 2016-09-12 LAB — ACETAMINOPHEN LEVEL

## 2016-09-12 LAB — ETHANOL: Alcohol, Ethyl (B): <5 mg/dL (ref <5)

## 2016-09-12 NOTE — BH Assessment (Signed)
Assessment Note  Jeremy Schultz is an 45 y.o. male presenting to the ED via EMS from [facility] in [place].  Patient reports increasing symptoms of depression.  He attributes his depression to the fact that all of his family members are no longer living and states that he's "alone in the world".  He also reports that his girlfriend broke up with him.  Patient denies SI/HI and any auditory/visual hallucinations.  He states he would like to be admitted to the inpatient unit.  Diagnosis: Bipolar disorder  Past Medical History:  Past Medical History:  Diagnosis Date  . Bipolar 1 disorder (HCC) 06/26/2016  . Diabetes mellitus without complication (HCC)   . Hypertension   . Schizophrenia (HCC)   . Seizures (HCC)     Past Surgical History:  Procedure Laterality Date  . APPENDECTOMY      Family History: No family history on file.  Social History:  reports that he has been smoking.  He has been smoking about 1.00 pack per day. He has never used smokeless tobacco. He reports that he does not drink alcohol or use drugs.  Additional Social History:  Alcohol / Drug Use History of alcohol / drug use?: No history of alcohol / drug abuse  CIWA: CIWA-Ar BP: 131/76 Pulse Rate: 62 COWS:    Allergies: No Known Allergies  Home Medications:  (Not in a hospital admission)  OB/GYN Status:  No LMP for male patient.  General Assessment Data [facility] Assessment: In system Is this a Tele or Face-to-Face Assessment?: Face-to-Face Is this an Initial Assessment or a Re-assessment for this encounter?: Initial Assessment Marital status: Single Maiden name: n/a Is patient pregnant?: No Pregnancy Status: No Living Arrangements: Group Home ([facility]) Can pt return to current living arrangement?: Yes Admission Status: Involuntary Is patient capable of signing voluntary admission?: No Referral Source: Other (group home) Insurance type:  Medicaid  Medical Screening Exam [facility] Walk-in ONLY) Medical Exam completed: Yes  Crisis Care Plan Living Arrangements: Group Home ([facility]) Legal Guardian: Other: Purvis Sheffield(Darryl Moore [phone]) Name of Psychiatrist: none reported Name of Therapist: none reported  Education Status Is patient currently in school?: No Current Grade: n/a Highest grade of school patient has completed: n/a Name of school: n/a Contact person: n/a  Risk to self with the past 6 months Suicidal Ideation: No Has patient been a risk to self within the past 6 months prior to admission? : No Suicidal Intent: No Has patient had any suicidal intent within the past 6 months prior to admission? : No Is patient at risk for suicide?: No Suicidal Plan?: No Has patient had any suicidal plan within the past 6 months prior to admission? : No Access to Means: No What has been your use of drugs/alcohol within the last 12 months?: Patient denies drug use Previous Attempts/Gestures: No How many times?: 0 Other Self Harm Risks: none identified Triggers for Past Attempts: None known Intentional Self Injurious Behavior: None Family Suicide History: No Recent stressful life event(s): Loss (Comment) (Loss of family and friends) Persecutory voices/beliefs?: No Depression: Yes Depression Symptoms: Tearfulness, Despondent, Loss of interest in usual pleasures Substance abuse history and/or treatment for substance abuse?: No Suicide prevention information given to non-admitted patients: Not applicable  Risk to Others within the past 6 months Homicidal Ideation: No Does patient have any lifetime risk of violence toward others beyond the six months prior to admission? : No Thoughts of Harm to Others: No  Current Homicidal Intent: No Current Homicidal Plan: No Access to Homicidal Means: No Identified Victim: none identified History of harm to others?: No Assessment of Violence: None Noted Violent Behavior Description:  none identified Does patient have access to weapons?: No Criminal Charges Pending?: No Does patient have a court date: No Is patient on probation?: No  Psychosis Hallucinations: None noted Delusions: None noted  Mental Status Report Appearance/Hygiene: In scrubs Eye Contact: Fair Motor Activity: Freedom of movement Speech: Logical/coherent Level of Consciousness: Crying Mood: Depressed, Sad Affect: Appropriate to circumstance, Depressed, Sad Anxiety Level: Minimal Thought Processes: Relevant Judgement: Partial Orientation: Person, Place, Time, Situation Obsessive Compulsive Thoughts/Behaviors: None  Cognitive Functioning Concentration: Normal Memory: Recent Intact, Remote Intact IQ: Average Insight: Fair Impulse Control: Fair Appetite: Good Weight Loss: 0 Weight Gain: 0 Sleep: Decreased Total Hours of Sleep: 6 Vegetative Symptoms: None  ADLScreening [facility]) Patient's cognitive ability adequate to safely complete daily activities?: Yes Patient able to express need for assistance with ADLs?: Yes Independently performs ADLs?: Yes (appropriate for developmental age)  Prior Inpatient Therapy Prior Inpatient Therapy: No Prior Therapy Dates: n/a Prior Therapy Facilty/Provider(s): n/a Reason for Treatment: n/a  Prior Outpatient Therapy Prior Outpatient Therapy: No Prior Therapy Dates: n/a Prior Therapy Facilty/Provider(s): n/a Reason for Treatment: n/a Does patient have an ACCT team?: No Does patient have Intensive In-House Services?  : No Does patient have [facility] services? : No Does patient have P4CC services?: No  ADL Screening (condition at time of admission) Patient's cognitive ability adequate to safely complete daily activities?: Yes Patient able to express need for assistance with ADLs?: Yes Independently performs ADLs?: Yes (appropriate for developmental age)       Abuse/Neglect Assessment (Assessment to be complete while patient is  alone) Physical Abuse: Denies Verbal Abuse: Denies Sexual Abuse: Denies Exploitation of patient/patient's resources: Denies Self-Neglect: Denies Values / Beliefs Cultural Requests During Hospitalization: None Spiritual Requests During Hospitalization: None Consults Spiritual Care Consult Needed: No Social Work Consult Needed: No Merchant navy officer (For Healthcare) Does patient have an advance directive?: No Would patient like information on creating an advanced directive?: No - patient declined information    Additional Information 1:1 In Past 12 Months?: No CIRT Risk: No Elopement Risk: No Does patient have medical clearance?: Yes     Disposition:  Disposition Initial Assessment Completed for this Encounter: Yes Disposition of Patient: Other dispositions Other disposition(s): Other (Comment) (Pending Psych MD consult)  On Site Evaluation by:   Reviewed with Physician:    [place] 09/12/2016 10:47 PM

## 2016-09-12 NOTE — ED Provider Notes (Signed)
[facility] Emergency Department Provider Note   ____________________________________________   First MD Initiated Contact with Patient 09/12/16 2201     (approximate)  I have reviewed the triage vital signs and the nursing notes.   HISTORY  Chief Complaint Mental Health Problem    HPI Jeremy Schultz is a 45 y.o. male with history of diabetes, hypertension, schizophrenia and seizures who presents voluntarily via EMS for depression as well as auditory hallucinations, ongoing for some time, gradual, constant, severe today, no modifying factors. Patient is unable to give me a very clear story however he reports that today he went to a local church and told him that he was very depressed of the call 911 so that an ambulance to bring him here. He denies any SI or HI. He reports that he has been hearing voices telling him that he is worthless and that he'll never amount to anything. He denies any command auditory hallucinations or visual hallucinations. He denies any recent illness including no chest pain, vomiting, diarrhea, fevers or chills. He was having some "stomach pains" when he arrives however those resolved after we gave him a meal tray which he ate heartily. At the end of my history taking he also reports to me "I think may I just need a break from that group home".   Past Medical History:  Diagnosis Date  . Bipolar 1 disorder (HCC) 06/26/2016  . Diabetes mellitus without complication (HCC)   . Hypertension   . Schizophrenia (HCC)   . Seizures [facility])     Patient Active Problem List   Diagnosis Date Noted  . Diabetes mellitus without complication (HCC) 06/27/2016  . Schizophrenia (HCC) 09/01/2015  . High blood pressure 09/01/2015  . Seizure disorder (HCC) 09/01/2015    Past Surgical History:  Procedure Laterality Date  . APPENDECTOMY      Prior to Admission medications   Medication Sig Start Date End Date Taking? Authorizing Provider    benztropine (COGENTIN) 1 MG tablet Take 1 mg by mouth 2 (two) times daily.    Historical Provider, MD  diphenhydrAMINE (BENADRYL) 25 MG tablet Take 25 mg by mouth at bedtime.    Historical Provider, MD  gabapentin (NEURONTIN) 300 MG capsule Take 300 mg by mouth 2 (two) times daily.    Historical Provider, MD  LORazepam (ATIVAN) 1 MG tablet Take 1 mg by mouth every 6 (six) hours as needed for anxiety.    Historical Provider, MD  metFORMIN (GLUCOPHAGE) 500 MG tablet Take 500 mg by mouth 2 (two) times daily with a meal.    Historical Provider, MD  methocarbamol (ROBAXIN) 500 MG tablet Take 500 mg by mouth 3 (three) times daily.    Historical Provider, MD  omeprazole (PRILOSEC) 20 MG capsule Take 40 mg by mouth 2 (two) times daily.    Historical Provider, MD  prazosin (MINIPRESS) 1 MG capsule Take 1 mg by mouth 2 (two) times daily.    Historical Provider, MD  propranolol (INDERAL) 20 MG tablet Take 20 mg by mouth 3 (three) times daily.    Historical Provider, MD  Skin Protectants, Misc. (EUCERIN) cream Apply 1 application topically 2 (two) times daily.    Historical Provider, MD    Allergies Review of patient's allergies indicates no known allergies.  No family history on file.  Social History Social History  Substance Use Topics  . Smoking status: Current Every Day Smoker    Packs/day: 1.00  . Smokeless tobacco: Never Used  .  Alcohol use No    Review of Systems Constitutional: No fever/chills Eyes: No visual changes. ENT: No sore throat. Cardiovascular: Denies chest pain. Respiratory: Denies shortness of breath. Gastrointestinal: No abdominal pain.  No nausea, no vomiting.  No diarrhea.  No constipation. Genitourinary: Negative for dysuria. Musculoskeletal: Negative for back pain. Skin: Negative for rash. Neurological: Negative for headaches, focal weakness or numbness.  10-point ROS otherwise negative.  ____________________________________________   PHYSICAL EXAM:  VITAL  SIGNS: ED Triage Vitals [09/12/16 2041]  Enc Vitals Group     BP 134/85     Pulse Rate 64     Resp 18     Temp 97.6 F (36.4 C)     Temp Source Oral     SpO2 97 %     Weight 130 lb (59 kg)     Height 5\' 7"  (1.702 m)     Head Circumference      Peak Flow      Pain Score      Pain Loc      Pain Edu?      Excl. in GC?     Constitutional: Alert and oriented. Well appearing and in no acute distress. Eyes: Conjunctivae are normal. PERRL. EOMI. Head: Atraumatic. Nose: No congestion/rhinnorhea. Mouth/Throat: Mucous membranes are moist.  Oropharynx non-erythematous. Neck: No stridor.  Cardiovascular: Normal rate, regular rhythm. Grossly normal heart sounds.  Good peripheral circulation. Respiratory: Normal respiratory effort.  No retractions. Lungs CTAB. Gastrointestinal: Soft and nontender. No distention.  No CVA tenderness. Genitourinary: deferred Musculoskeletal: No lower extremity tenderness nor edema.  No joint effusions. Neurologic:  Normal speech and language. No gross focal neurologic deficits are appreciated. No gait instability. Skin:  Skin is warm, dry and intact. No rash noted. Psychiatric: Mood is depressed, affect is flat. Speech and behavior are normal.  ____________________________________________   LABS (all labs ordered are listed, but only abnormal results are displayed)  Labs Reviewed  COMPREHENSIVE METABOLIC PANEL - Abnormal; Notable for the following:       Result Value   Sodium 129 (*)    Chloride 97 (*)    Alkaline Phosphatase 29 (*)    All other components within normal limits  ACETAMINOPHEN LEVEL - Abnormal; Notable for the following:    Acetaminophen (Tylenol), Serum <10 (*)    All other components within normal limits  CBC - Abnormal; Notable for the following:    RBC 4.14 (*)    HCT 38.9 (*)    Platelets 139 (*)    All other components within normal limits  ETHANOL  SALICYLATE LEVEL  URINE DRUG SCREEN, QUALITATIVE ([facility] ONLY)    ____________________________________________  EKG  none ____________________________________________  RADIOLOGY  none ____________________________________________   PROCEDURES  Procedure(s) performed: None  Procedures  Critical Care performed: No  ____________________________________________   INITIAL IMPRESSION / ASSESSMENT AND PLAN / ED COURSE  Pertinent labs & imaging results that were available during my care of the patient were reviewed by me and considered in my medical decision making (see chart for details).  Jeremy Schultz is a 45 y.o. male with history of diabetes, hypertension, schizophrenia and seizures who presents voluntarily via EMS for depression as well as auditory hallucinations, ongoing for some time though I suspect he is also malingering somewhat because he dislikes his group home. On exam, he is well-appearing and in no acute distress. His vital signs are stable and he is afebrile. He has a benign physical exam and no acute medical complaints. CMP shows  hyponatremia with a sodium of 129 however he has had this previously on chart review. He is sitting up in bed eating and drinking well. CBC unremarkable. Undetectable ethanol, acetaminophen and salicylate levels. Awaiting urine drug screen. He is medically cleared for psychiatric evaluation. Consult TTS and psychiatry. Care to Dr. Don PerkingVeronese at 11 PM  Clinical Course     ____________________________________________   FINAL CLINICAL IMPRESSION(S) / ED DIAGNOSES  Final diagnoses:  Hallucinations  Depression, unspecified depression type      NEW MEDICATIONS STARTED DURING THIS VISIT:  New Prescriptions   No medications on file     Note:  This document was prepared using Dragon voice recognition software and may include unintentional dictation errors.    Gayla DossEryka A Mailey Landstrom, MD 09/12/16 [phone]

## 2016-09-12 NOTE — ED Notes (Signed)
Kennedy Buckerhanh, EDT, to triage to dress pt into behav scrubs

## 2016-09-12 NOTE — ED Triage Notes (Signed)
Patient ambulatory to triage with steady gait, without difficulty or distress noted; pt reports here for depression; denies SI or HI; brought [facility] by [employer] [facility] from [facility] in [place]; pt walked to church and called EMS

## 2016-09-13 DIAGNOSIS — F203 Undifferentiated schizophrenia: Secondary | ICD-10-CM | POA: Diagnosis not present

## 2016-09-13 LAB — URINE DRUG SCREEN, QUALITATIVE (ARMC ONLY)
AMPHETAMINES, UR SCREEN: NOT DETECTED
BENZODIAZEPINE, UR SCRN: NOT DETECTED
Barbiturates, Ur Screen: NOT DETECTED
Cannabinoid 50 Ng, Ur ~~LOC~~: NOT DETECTED
Cocaine Metabolite,Ur ~~LOC~~: NOT DETECTED
MDMA (Ecstasy)Ur Screen: NOT DETECTED
METHADONE SCREEN, URINE: NOT DETECTED
Opiate, Ur Screen: NOT DETECTED
PHENCYCLIDINE (PCP) UR S: NOT DETECTED
TRICYCLIC, UR SCREEN: NOT DETECTED

## 2016-09-13 NOTE — ED Notes (Signed)
Pt D/C with caregiver from group home. NAD noted at this time. Pt and caregiver state understanding of D/C follow up instructions. Pt ambulatory to the lobby at this time.

## 2016-09-13 NOTE — ED Notes (Signed)
Spoke with caregiver at [facility] [phone], states he will be here to pick up patient in approx 1 hr.

## 2016-09-13 NOTE — ED Notes (Signed)
Pt taking a shower in quad bathroom.

## 2016-09-13 NOTE — ED Notes (Signed)
Pt given lunch tray.

## 2016-09-13 NOTE — ED Provider Notes (Signed)
Case discussed with Dr. Toni Amendlapacs after his evaluation in the ED. His consult note was reviewed as well. Patient psychiatrically stable, having some feelings of sadness but otherwise not a danger to himself or others. Medically stable. We'll discharge home, follow-up with [facility] and primary care.   Sharman CheekPhillip Kajuana Shareef, MD 09/13/16 1410

## 2016-09-13 NOTE — ED Notes (Signed)
Jeremy RushLou Anne, from Patient relations at bedside talking to patient at this time. Pt is noted to be tearful and crying in the hallway, pt calmed down with Patient Relations at bedside.

## 2016-09-13 NOTE — ED Notes (Signed)
Pt given apple juice  

## 2016-09-13 NOTE — ED Notes (Signed)
Pt ambulated to bathroom. Pt resting in bed.

## 2016-09-13 NOTE — Consult Note (Signed)
Jeremy Schultz   Reason for Schultz:  Schultz for 45 year old man with a history of chronic mental illness brought here from his group home because of acute sadness Referring Physician:  Joni Fears Patient Identification: Jeremy Schultz MRN:  [id number] Principal Diagnosis: Schizophrenia [facility]) Diagnosis:   Patient Active Problem List   Diagnosis Date Noted  . Diabetes mellitus without complication ([place]) [W26.3] 06/27/2016  . Schizophrenia ([place]) [F20.9] 09/01/2015  . High blood pressure [I10] 09/01/2015  . Seizure disorder [facility]) [Z85.885] 09/01/2015    Total Time spent with patient: 1 hour  Subjective:   Jeremy Schultz is a 45 y.o. male patient admitted with "I dont want to go back there".  HPI:  Patient interviewed. Chart reviewed. Patient familiar from prior encounters. 2 -year-old man with a history of schizoaffective disorder or schizophrenia. He was brought here voluntarily because of feeling sad and overwhelmed. Patient says that he has been feeling sad recently. Last night acutely he got overwhelmed by something that touched him or made him feel sad. He started crying and the called an ambulance to bring him here from church. Patient is vague in his history. Talks about how the staff at the group home aren't nice to him. Doesn't really have specific complaints. He has been at his current group home for years. Finds it very boring. There is nothing much to do during the day. He says that he doesn't sleep all that well. Appetite appears to be adequate. Doesn't have any specific other medical complaints. Compliant with medicine. Not abusing alcohol or drugs. Patient made it clear to me that he was not having any suicidal thoughts or wish to harm himself or thoughts to hurt anyone else. Denies being aware of hallucinations but seems confused by the question. No report that he's been acting in a dangerous or bizarre manner recently.  Social history: According to the  patient his family died when he was a small child in a accident and he was raised in foster care. He has a guardian and has been in foster care or group home type settings pretty much all his life. Minimal social contact outside the group home.  Medical history: Diabetes managed with oral medicine. Hypertension. History of a past seizure disorder although am not clear on the details of that.  Substance abuse history: Denies past alcohol or drug abuse no evidence of current substance abuse Past Psychiatric History: Patient has had previous hospitalizations but it has been a while. Doesn't think that he is ever actually tried to kill himself or been violent anyone else in the past. Currently maintained on psychiatric medicine and has a regular provider the checks in with him at the group home. Diagnosis of schizophrenia. Often seems to have some behavior and thinking consistent with developmental disability as well.  Risk to Self: Suicidal Ideation: No Suicidal Intent: No Is patient at risk for suicide?: No Suicidal Plan?: No Access to Means: No What has been your use of drugs/alcohol within the last 12 months?: Patient denies drug use How many times?: 0 Other Self Harm Risks: none identified Triggers for Past Attempts: None known Intentional Self Injurious Behavior: None Risk to Others: Homicidal Ideation: No Thoughts of Harm to Others: No Current Homicidal Intent: No Current Homicidal Plan: No Access to Homicidal Means: No Identified Victim: none identified History of harm to others?: No Assessment of Violence: None Noted Violent Behavior Description: none identified Does patient have access to weapons?: No Criminal Charges Pending?: No Does patient  have a court date: No Prior Inpatient Therapy: Prior Inpatient Therapy: No Prior Therapy Dates: n/a Prior Therapy Facilty/Provider(s): n/a Reason for Treatment: n/a Prior Outpatient Therapy: Prior Outpatient Therapy: No Prior Therapy  Dates: n/a Prior Therapy Facilty/Provider(s): n/a Reason for Treatment: n/a Does patient have an ACCT team?: No Does patient have Intensive In-House Services?  : No Does patient have [facility] services? : No Does patient have P4CC services?: No  Past Medical History:  Past Medical History:  Diagnosis Date  . Bipolar 1 disorder ([place]) 06/26/2016  . Diabetes mellitus without complication ([place])   . Hypertension   . Schizophrenia ([place])   . Seizures ([place])     Past Surgical History:  Procedure Laterality Date  . APPENDECTOMY     Family History: No family history on file. Family Psychiatric  History: Unknown. Nothing known about his family Social History:  History  Alcohol Use No     History  Drug Use No    Social History   Social History  . Marital status: Single    Spouse name: N/A  . Number of children: N/A  . Years of education: N/A   Social History Main Topics  . Smoking status: Current Every Day Smoker    Packs/day: 1.00  . Smokeless tobacco: Never Used  . Alcohol use No  . Drug use: No  . Sexual activity: Not Asked   Other Topics Concern  . None   Social History Narrative  . None   Additional Social History:    Allergies:  No Known Allergies  Labs:  Results for orders placed or performed during the hospital encounter of 09/12/16 (from the past 48 hour(s))  Comprehensive metabolic panel     Status: Abnormal   Collection Time: 09/12/16  8:43 PM  Result Value Ref Range   Sodium 129 (L) 135 - 145 mmol/L   Potassium 4.3 3.5 - 5.1 mmol/L   Chloride 97 (L) 101 - 111 mmol/L   CO2 25 22 - 32 mmol/L   Glucose, Bld 96 65 - 99 mg/dL   BUN 11 6 - 20 mg/dL   Creatinine, Ser 0.76 0.61 - 1.24 mg/dL   Calcium 9.0 8.9 - 10.3 mg/dL   Total Protein 6.9 6.5 - 8.1 g/dL   Albumin 3.5 3.5 - 5.0 g/dL   AST 32 15 - 41 U/L   ALT 28 17 - 63 U/L   Alkaline Phosphatase 29 (L) 38 - 126 U/L   Total Bilirubin 0.7 0.3 - 1.2 mg/dL   GFR calc non Af Amer >60 >60 mL/min   GFR  calc Af Amer >60 >60 mL/min    Comment: (NOTE) The eGFR has been calculated using the CKD EPI equation. This calculation has not been validated in all clinical situations. eGFR's persistently <60 mL/min signify possible Chronic Kidney Disease.    Anion gap 7 5 - 15  Ethanol     Status: None   Collection Time: 09/12/16  8:43 PM  Result Value Ref Range   Alcohol, Ethyl (B) <5 <5 mg/dL    Comment:        LOWEST DETECTABLE LIMIT FOR SERUM ALCOHOL IS 5 mg/dL FOR MEDICAL PURPOSES ONLY   Salicylate level     Status: None   Collection Time: 09/12/16  8:43 PM  Result Value Ref Range   Salicylate Lvl <3.2 2.8 - 30.0 mg/dL  Acetaminophen level     Status: Abnormal   Collection Time: 09/12/16  8:43 PM  Result Value Ref Range  Acetaminophen (Tylenol), Serum <10 (L) 10 - 30 ug/mL    Comment:        THERAPEUTIC CONCENTRATIONS VARY SIGNIFICANTLY. A RANGE OF 10-30 ug/mL MAY BE AN EFFECTIVE CONCENTRATION FOR MANY PATIENTS. HOWEVER, SOME ARE BEST TREATED AT CONCENTRATIONS OUTSIDE THIS RANGE. ACETAMINOPHEN CONCENTRATIONS >150 ug/mL AT 4 HOURS AFTER INGESTION AND >50 ug/mL AT 12 HOURS AFTER INGESTION ARE OFTEN ASSOCIATED WITH TOXIC REACTIONS.   cbc     Status: Abnormal   Collection Time: 09/12/16  8:43 PM  Result Value Ref Range   WBC 4.5 3.8 - 10.6 K/uL   RBC 4.14 (L) 4.40 - 5.90 MIL/uL   Hemoglobin 13.8 13.0 - 18.0 g/dL   HCT 38.9 (L) 40.0 - 52.0 %   MCV 94.0 80.0 - 100.0 fL   MCH 33.4 26.0 - 34.0 pg   MCHC 35.5 32.0 - 36.0 g/dL   RDW 13.2 11.5 - 14.5 %   Platelets 139 (L) 150 - 440 K/uL  Urine Drug Screen, Qualitative     Status: None   Collection Time: 09/13/16  2:20 AM  Result Value Ref Range   Tricyclic, Ur Screen NONE DETECTED NONE DETECTED   Amphetamines, Ur Screen NONE DETECTED NONE DETECTED   MDMA (Ecstasy)Ur Screen NONE DETECTED NONE DETECTED   Cocaine Metabolite,Ur [place] NONE DETECTED NONE DETECTED   Opiate, Ur Screen NONE DETECTED NONE DETECTED   Phencyclidine (PCP)  Ur S NONE DETECTED NONE DETECTED   Cannabinoid 50 Ng, Ur [place] NONE DETECTED NONE DETECTED   Barbiturates, Ur Screen NONE DETECTED NONE DETECTED   Benzodiazepine, Ur Scrn NONE DETECTED NONE DETECTED   Methadone Scn, Ur NONE DETECTED NONE DETECTED    Comment: (NOTE) 621  Tricyclics, urine               Cutoff 1000 ng/mL 200  Amphetamines, urine             Cutoff 1000 ng/mL 300  MDMA (Ecstasy), urine           Cutoff 500 ng/mL 400  Cocaine Metabolite, urine       Cutoff 300 ng/mL 500  Opiate, urine                   Cutoff 300 ng/mL 600  Phencyclidine (PCP), urine      Cutoff 25 ng/mL 700  Cannabinoid, urine              Cutoff 50 ng/mL 800  Barbiturates, urine             Cutoff 200 ng/mL 900  Benzodiazepine, urine           Cutoff 200 ng/mL 1000 Methadone, urine                Cutoff 300 ng/mL 1100 1200 The urine drug screen provides only a preliminary, unconfirmed 1300 analytical test result and should not be used for non-medical 1400 purposes. Clinical consideration and professional judgment should 1500 be applied to any positive drug screen result due to possible 1600 interfering substances. A more specific alternate chemical method 1700 must be used in order to obtain a confirmed analytical result.  1800 Gas chromato graphy / mass spectrometry (GC/MS) is the preferred 1900 confirmatory method.     No current facility-administered medications for this encounter.    Current Outpatient Prescriptions  Medication Sig Dispense Refill  . benztropine (COGENTIN) 1 MG tablet Take 1 mg by mouth 2 (two) times daily.    . diphenhydrAMINE (BENADRYL) 25  MG tablet Take 25 mg by mouth at bedtime.    . gabapentin (NEURONTIN) 300 MG capsule Take 300 mg by mouth 2 (two) times daily.    Marland Kitchen LORazepam (ATIVAN) 1 MG tablet Take 1 mg by mouth every 6 (six) hours as needed for anxiety.    . metFORMIN (GLUCOPHAGE) 500 MG tablet Take 500 mg by mouth 2 (two) times daily with a meal.    . methocarbamol  (ROBAXIN) 500 MG tablet Take 500 mg by mouth 3 (three) times daily.    Marland Kitchen omeprazole (PRILOSEC) 20 MG capsule Take 40 mg by mouth 2 (two) times daily.    . prazosin (MINIPRESS) 1 MG capsule Take 1 mg by mouth 2 (two) times daily.    . propranolol (INDERAL) 20 MG tablet Take 20 mg by mouth 3 (three) times daily.    . Skin Protectants, Misc. (EUCERIN) cream Apply 1 application topically 2 (two) times daily.      Musculoskeletal: Strength & Muscle Tone: within normal limits Gait & Station: normal Patient leans: N/A  Psychiatric Specialty Exam: Physical Exam  Nursing note and vitals reviewed. Constitutional: He appears well-developed and well-nourished.  HENT:  Head: Normocephalic and atraumatic.  Eyes: Conjunctivae are normal. Pupils are equal, round, and reactive to light.  Neck: Normal range of motion.  Cardiovascular: Regular rhythm and normal heart sounds.   Respiratory: Effort normal. No respiratory distress.  GI: Soft.  Musculoskeletal: Normal range of motion.  Neurological: He is alert.  Skin: Skin is warm and dry.  Psychiatric: His speech is tangential. He is slowed. He expresses impulsivity. He exhibits a depressed mood. He expresses no homicidal and no suicidal ideation. He exhibits abnormal recent memory and abnormal remote memory.    Review of Systems  Constitutional: Negative.   HENT: Negative.   Eyes: Negative.   Respiratory: Negative.   Cardiovascular: Negative.   Gastrointestinal: Negative.   Musculoskeletal: Negative.   Skin: Negative.   Neurological: Negative.   Psychiatric/Behavioral: Positive for depression. Negative for hallucinations, memory loss, substance abuse and suicidal ideas. The patient is nervous/anxious and has insomnia.     Blood pressure (!) 144/87, pulse 64, temperature 98.1 F (36.7 C), temperature source Oral, resp. rate 16, height 5' 7"  (1.702 m), weight 59 kg (130 lb), SpO2 95 %.Body mass index is 20.36 kg/m.  General Appearance: Casual   Eye Contact:  Minimal  Speech:  Slow and Slurred  Volume:  Decreased  Mood:  Depressed and Dysphoric  Affect:  Tearful  Thought Process:  Goal Directed  Orientation:  Full (Time, Place, and Person)  Thought Content:  Logical and Rumination  Suicidal Thoughts:  No  Homicidal Thoughts:  No  Memory:  Immediate;   Good Recent;   Fair Remote;   Fair  Judgement:  Impaired  Insight:  Lacking  Psychomotor Activity:  Decreased  Concentration:  Concentration: Fair  Recall:  [employer] of Knowledge:  Fair  Language:  Fair  Akathisia:  No  Handed:  Right  AIMS (if indicated):     Assets:  Communication Skills Desire for Improvement Financial Resources/Insurance Housing Physical Health  ADL's:  Intact  Cognition:  Impaired,  Mild  Sleep:        Treatment Plan Summary: Medication management and Plan 44 year old man with schizophrenia. Got overwhelmed and upset last night. Still a little tearful today. No evidence of suicidal or homicidal thought or behavior. Spent some time offering supportive counseling and sympathy. Patient does not require inpatient hospitalization. No reason  that I can see at this point for him to be moved out of his current living facility. No change to his medicine. Patient is not under IVC. He can be released from the emergency room and discharged at the discretion of the emergency room physician and follow-up with his local provider.  Disposition: Patient does not meet criteria for psychiatric inpatient admission. Supportive therapy provided about ongoing stressors.  Alethia Berthold, MD 09/13/2016 12:56 PM

## 2016-09-13 NOTE — ED Notes (Signed)
Pt given breakfast tray

## 2016-09-13 NOTE — ED Notes (Signed)
Dr. Clapacs at bedside at this time.  

## 2016-09-17 ENCOUNTER — Ambulatory Visit: Payer: Medicaid Other | Admitting: Podiatry

## 2016-09-21 ENCOUNTER — Ambulatory Visit: Payer: Medicaid Other | Admitting: Podiatry

## 2016-10-05 ENCOUNTER — Ambulatory Visit (INDEPENDENT_AMBULATORY_CARE_PROVIDER_SITE_OTHER): Payer: Medicaid Other | Admitting: Podiatry

## 2016-10-05 ENCOUNTER — Encounter: Payer: Self-pay | Admitting: Podiatry

## 2016-10-05 DIAGNOSIS — L608 Other nail disorders: Secondary | ICD-10-CM

## 2016-10-05 DIAGNOSIS — M79676 Pain in unspecified toe(s): Secondary | ICD-10-CM

## 2016-10-05 DIAGNOSIS — B351 Tinea unguium: Secondary | ICD-10-CM

## 2016-10-05 DIAGNOSIS — E0843 Diabetes mellitus due to underlying condition with diabetic autonomic (poly)neuropathy: Secondary | ICD-10-CM

## 2016-10-05 DIAGNOSIS — L603 Nail dystrophy: Secondary | ICD-10-CM

## 2016-10-05 DIAGNOSIS — M79609 Pain in unspecified limb: Principal | ICD-10-CM

## 2016-10-06 NOTE — Progress Notes (Signed)
SUBJECTIVE Patient with a history of diabetes mellitus presents to office today complaining of elongated, thickened nails. Pain while ambulating in shoes. Patient is unable to trim their own nails.   No Known Allergies  OBJECTIVE General Patient is awake, alert, and oriented x 3 and in no acute distress. Derm Skin is dry and supple bilateral. Negative open lesions or macerations. Remaining integument unremarkable. Nails are tender, long, thickened and dystrophic with subungual debris, consistent with onychomycosis, 1-5 bilateral. No signs of infection noted. Vasc  DP and PT pedal pulses palpable bilaterally. Temperature gradient within normal limits.  Neuro Epicritic and protective threshold sensation diminished bilaterally.  Musculoskeletal Exam No symptomatic pedal deformities noted bilateral. Muscular strength within normal limits.  ASSESSMENT 1. Diabetes Mellitus w/ peripheral neuropathy 2. Onychomycosis of nail due to dermatophyte bilateral 3. Pain in foot bilateral  PLAN OF CARE 1. Patient evaluated today. 2. Instructed to maintain good pedal hygiene and foot care. Stressed importance of controlling blood sugar.  3. Mechanical debridement of nails 1-5 bilaterally performed using a nail nipper. Filed with dremel without incident.  4. Return to clinic in 3 mos.    Brent M Evans, DPM   

## 2017-01-08 ENCOUNTER — Ambulatory Visit: Payer: Medicaid Other | Admitting: Podiatry

## 2017-01-10 ENCOUNTER — Ambulatory Visit: Payer: Medicaid Other | Admitting: Podiatry

## 2017-01-21 ENCOUNTER — Encounter: Payer: Self-pay | Admitting: Podiatry

## 2017-01-21 ENCOUNTER — Ambulatory Visit (INDEPENDENT_AMBULATORY_CARE_PROVIDER_SITE_OTHER): Payer: Medicaid Other | Admitting: Podiatry

## 2017-01-21 DIAGNOSIS — M79676 Pain in unspecified toe(s): Secondary | ICD-10-CM | POA: Diagnosis not present

## 2017-01-21 DIAGNOSIS — M79609 Pain in unspecified limb: Principal | ICD-10-CM

## 2017-01-21 DIAGNOSIS — B351 Tinea unguium: Secondary | ICD-10-CM

## 2017-01-21 NOTE — Progress Notes (Signed)
Complaint:  Visit Type: Patient returns to my office for continued preventative foot care services. Complaint: Patient states" my nails have grown long and thick and become painful to walk and wear shoes" Patient has been diagnosed with DM with neuropathy.. The patient presents for preventative foot care services. No changes to ROS  Podiatric Exam: Vascular: dorsalis pedis and posterior tibial pulses are palpable bilateral. Capillary return is immediate. Temperature gradient is WNL. Skin turgor WNL  Sensorium: Diminished  Semmes Weinstein monofilament test. Normal tactile sensation bilaterally. Nail Exam: Pt has thick disfigured discolored nails with subungual debris noted bilateral entire nail hallux through fifth toenails Ulcer Exam: There is no evidence of ulcer or pre-ulcerative changes or infection. Orthopedic Exam: Muscle tone and strength are WNL. No limitations in general ROM. No crepitus or effusions noted. Foot type and digits show no abnormalities. Bony prominences are unremarkable. Skin: No Porokeratosis. No infection or ulcers  Diagnosis:  Onychomycosis, , Pain in right toe, pain in left toes  Treatment & Plan Procedures and Treatment: Consent by patient was obtained for treatment procedures. The patient understood the discussion of treatment and procedures well. All questions were answered thoroughly reviewed. Debridement of mycotic and hypertrophic toenails, 1 through 5 bilateral and clearing of subungual debris. No ulceration, no infection noted.  Return Visit-Office Procedure: Patient instructed to return to the office for a follow up visit 3 months for continued evaluation and treatment.    Dorion Petillo DPM 

## 2017-03-29 ENCOUNTER — Emergency Department (HOSPITAL_COMMUNITY)
Admission: EM | Admit: 2017-03-29 | Discharge: 2017-03-30 | Disposition: A | Discharge status: Home / Self Care | Payer: Medicaid Other | Attending: Emergency Medicine | Admitting: Emergency Medicine

## 2017-03-29 ENCOUNTER — Encounter (HOSPITAL_COMMUNITY): Payer: Self-pay | Admitting: Emergency Medicine

## 2017-03-29 DIAGNOSIS — I10 Essential (primary) hypertension: Secondary | ICD-10-CM | POA: Diagnosis not present

## 2017-03-29 DIAGNOSIS — F32A Depression, unspecified: Secondary | ICD-10-CM

## 2017-03-29 DIAGNOSIS — Z7984 Long term (current) use of oral hypoglycemic drugs: Secondary | ICD-10-CM | POA: Diagnosis not present

## 2017-03-29 DIAGNOSIS — E119 Type 2 diabetes mellitus without complications: Secondary | ICD-10-CM | POA: Diagnosis not present

## 2017-03-29 DIAGNOSIS — F17200 Nicotine dependence, unspecified, uncomplicated: Secondary | ICD-10-CM | POA: Insufficient documentation

## 2017-03-29 DIAGNOSIS — Z79899 Other long term (current) drug therapy: Secondary | ICD-10-CM | POA: Diagnosis not present

## 2017-03-29 DIAGNOSIS — F329 Major depressive disorder, single episode, unspecified: Secondary | ICD-10-CM | POA: Insufficient documentation

## 2017-03-29 DIAGNOSIS — R45851 Suicidal ideations: Secondary | ICD-10-CM

## 2017-03-29 DIAGNOSIS — F209 Schizophrenia, unspecified: Secondary | ICD-10-CM | POA: Diagnosis present

## 2017-03-29 LAB — CBC WITH DIFFERENTIAL/PLATELET
BASOS ABS: 0 10*3/uL (ref 0.0–0.1)
BASOS PCT: 0 %
EOS PCT: 1 %
Eosinophils Absolute: 0.1 10*3/uL (ref 0.0–0.7)
HCT: 39.1 % (ref 39.0–52.0)
Hemoglobin: 13.9 g/dL (ref 13.0–17.0)
LYMPHS PCT: 46 %
Lymphs Abs: 1.8 10*3/uL (ref 0.7–4.0)
MCH: 32.6 pg (ref 26.0–34.0)
MCHC: 35.5 g/dL (ref 30.0–36.0)
MCV: 91.8 fL (ref 78.0–100.0)
Monocytes Absolute: 0.6 10*3/uL (ref 0.1–1.0)
Monocytes Relative: 14 %
Neutro Abs: 1.5 10*3/uL — ABNORMAL LOW (ref 1.7–7.7)
Neutrophils Relative %: 39 %
PLATELETS: 125 10*3/uL — AB (ref 150–400)
RBC: 4.26 MIL/uL (ref 4.22–5.81)
RDW: 13.3 % (ref 11.5–15.5)
WBC: 4 10*3/uL (ref 4.0–10.5)

## 2017-03-29 LAB — BASIC METABOLIC PANEL
ANION GAP: 6 (ref 5–15)
BUN: 11 mg/dL (ref 6–20)
CALCIUM: 9.2 mg/dL (ref 8.9–10.3)
CO2: 29 mmol/L (ref 22–32)
Chloride: 96 mmol/L — ABNORMAL LOW (ref 101–111)
Creatinine, Ser: 0.86 mg/dL (ref 0.61–1.24)
GLUCOSE: 96 mg/dL (ref 65–99)
POTASSIUM: 4.5 mmol/L (ref 3.5–5.1)
SODIUM: 131 mmol/L — AB (ref 135–145)

## 2017-03-29 LAB — ETHANOL

## 2017-03-29 LAB — RAPID URINE DRUG SCREEN, HOSP PERFORMED
Amphetamines: NOT DETECTED
BENZODIAZEPINES: POSITIVE — AB
Barbiturates: NOT DETECTED
COCAINE: NOT DETECTED
OPIATES: NOT DETECTED
Tetrahydrocannabinol: NOT DETECTED

## 2017-03-29 MED ORDER — FENTANYL CITRATE (PF) 100 MCG/2ML IJ SOLN
INTRAMUSCULAR | Status: AC
Start: 2017-03-29 — End: 2017-03-30
  Filled 2017-03-29: qty 2

## 2017-03-29 NOTE — ED Triage Notes (Signed)
Pt from a home care residence [address] Unknown name. Per ems pt does not want to live there anymore. PD was there upon ems arrival due to pt pulling a knife prior to PD arrival. Hx of schizophrenia. Arrived calm but withdrawn

## 2017-03-29 NOTE — BH Assessment (Signed)
Tele Assessment Note   Jeremy Schultz is an 46 y.o. male  Presenting to the hospital after he retreived knife from the kitchen at his care home and put the knife to his wrist. He threatened to kill himself. Idell Pickles, worker at [facility] @ [phone] stated they don't have a locked kitchen, she was concerned for the safety of the patient and other residence. She was able to talk the patient into giving up the knife.   Reports the patient has been raging for a week or so, cursing and hollering.  His medications are prescribed by Duffy Rhody, NP [phone]. She works for the care home.  The patient is not contracting for safety. Reports the group home "treats me like dirt." The dislike of his care home appears to be a common theme from previous ED admissions.   Also, states he hears voices, telling him to shut his mouth. "I'd like to see them try to shut my mouth." Sometimes he thinks the wall is moving, when asked about visual halluncinations. The care home confirms the patient can do all of his own ADL's. The patient had fair eye contact, was irritable in mood, had rambling and garbled speech, blunted affect, tangential thought and decreased concentration, poor judgement. Denies HI.  Elta Guadeloupe, NP recommends inpatient treatment.    Diagnosis: Schizophrenia   Past Medical History:  Past Medical History:  Diagnosis Date  . Bipolar 1 disorder ([facility]) 06/26/2016  . Diabetes mellitus without complication ([facility])   . Hypertension   . Schizophrenia ([facility])   . Seizures ([facility])     Past Surgical History:  Procedure Laterality Date  . APPENDECTOMY      Family History: History reviewed. No pertinent family history.  Social History:  reports that he has been smoking.  He has been smoking about 1.00 pack per day. He has never used smokeless tobacco. He reports that he does not drink alcohol or use drugs.  Additional Social History:  Alcohol / Drug Use Pain  Medications: see MAR Prescriptions: see MAR Over the Counter: see MAR History of alcohol / drug use?: No history of alcohol / drug abuse  CIWA: CIWA-Ar BP: (!) 102/58 Pulse Rate: 65 COWS:    PATIENT STRENGTHS: (choose at least two) Average or above average intelligence General fund of knowledge  Allergies: No Known Allergies  Home Medications:  (Not in a hospital admission)  OB/GYN Status:  No LMP for male patient.  General Assessment Data Location of Assessment: [facility] TTS Assessment: In system Is this a Tele or Face-to-Face Assessment?: Tele Assessment Is this an Initial Assessment or a Re-assessment for this encounter?: Initial Assessment Marital status: Single Is patient pregnant?: No Pregnancy Status: No Living Arrangements: Group Home Can pt return to current living arrangement?: Yes Admission Status: Voluntary Is patient capable of signing voluntary admission?: Yes Referral Source: Self/Family/Friend Insurance type: MCD  Medical Screening Exam [facility] Walk-in ONLY) Medical Exam completed: Yes  Crisis Care Plan Living Arrangements: Group Home Legal Guardian: Other: Name of Psychiatrist: n/a Name of Therapist: n/a  Education Status Is patient currently in school?: No  Risk to self with the past 6 months Suicidal Ideation: Yes-Currently Present Has patient been a risk to self within the past 6 months prior to admission? : No Suicidal Intent: Yes-Currently Present Has patient had any suicidal intent within the past 6 months prior to admission? : No Is patient at risk for suicide?: Yes Suicidal Plan?: Yes-Currently Present Has patient had any  suicidal plan within the past 6 months prior to admission? : No Specify Current Suicidal Plan: to cut wrist Access to Means: Yes Specify Access to Suicidal Means: had knife ealier today What has been your use of drugs/alcohol within the last 12 months?: n/a Previous Attempts/Gestures: No How many times?: 0 Intentional  Self Injurious Behavior: None Family Suicide History: Unknown Persecutory voices/beliefs?: No Substance abuse history and/or treatment for substance abuse?: No Suicide prevention information given to non-admitted patients: Not applicable  Risk to Others within the past 6 months Homicidal Ideation: No Does patient have any lifetime risk of violence toward others beyond the six months prior to admission? : No Thoughts of Harm to Others: No Current Homicidal Intent: No Current Homicidal Plan: No Access to Homicidal Means: No History of harm to others?: No Does patient have access to weapons?: No Criminal Charges Pending?: No Does patient have a court date: No Is patient on probation?: No  Psychosis Hallucinations: Auditory, Visual Delusions: None noted  Mental Status Report Appearance/Hygiene: Unremarkable Eye Contact: Fair Motor Activity: Unremarkable Speech: Other (Comment) (garbled, rambling) Level of Consciousness: Alert Mood: Depressed Affect: Blunted Anxiety Level: None Thought Processes: Tangential Judgement: Impaired Orientation: Person, Place, Situation Obsessive Compulsive Thoughts/Behaviors: None  Cognitive Functioning Concentration: Decreased Memory: Recent Intact, Remote Intact IQ: Average Insight: Poor Impulse Control: Poor Appetite: Fair Sleep: Decreased  ADLScreening [facility] Assessment Services) Patient's cognitive ability adequate to safely complete daily activities?: Yes Patient able to express need for assistance with ADLs?: Yes Independently performs ADLs?: Yes (appropriate for developmental age)     Prior Outpatient Therapy Prior Outpatient Therapy: Yes Prior Therapy Dates: ongoing Prior Therapy Facilty/Provider(s): NP Reason for Treatment: schizophrenia Does patient have an ACCT team?: No Does patient have Intensive In-House Services?  : No Does patient have [facility] services? : No Does patient have P4CC services?: No  ADL Screening  (condition at time of admission) Patient's cognitive ability adequate to safely complete daily activities?: Yes Is the patient deaf or have difficulty hearing?: No Does the patient have difficulty seeing, even when wearing glasses/contacts?: No Does the patient have difficulty concentrating, remembering, or making decisions?: Yes Patient able to express need for assistance with ADLs?: Yes Does the patient have difficulty dressing or bathing?: No Independently performs ADLs?: Yes (appropriate for developmental age)       Abuse/Neglect Assessment (Assessment to be complete while patient is alone) Physical Abuse: Denies Verbal Abuse: Denies Sexual Abuse: Denies     Merchant navy officer (For Healthcare) Does Patient Have a Medical Advance Directive?: No    Additional Information 1:1 In Past 12 Months?: No CIRT Risk: No Elopement Risk: No Does patient have medical clearance?: Yes     Disposition:  Disposition Initial Assessment Completed for this Encounter: Yes Disposition of Patient: Inpatient treatment program Type of inpatient treatment program: Adult  Westley Hummer 03/29/2017 5:32 PM

## 2017-03-29 NOTE — Progress Notes (Signed)
Patient meets criteria for inpatient treatment and has been referred to the following facilities that accept referrals tonight: [facility], [facility], [facility], [place] [facility], [facility], and [facility].  Patient has medicaid and can not be referred to [facility], [facility], and [facility].  CSW in disposition will continue to seek placement for pt.  Melbourne Abts, LCSWA Disposition staff 03/29/2017 7:14 PM

## 2017-03-29 NOTE — ED Provider Notes (Signed)
[facility]-EMERGENCY DEPT Provider Note   CSN: [id number] Arrival date & time: 03/29/17  1331     History   Chief Complaint Chief Complaint  Patient presents with  . Medical Clearance    HPI Jeremy Schultz is a 46 y.o. male.  The history is provided by the patient and the EMS personnel. The history is limited by the condition of the patient (hx schizophrenia).    Pt was seen at 1405. Per EMS and pt report:  Pt states he pulled a knife out today to cut himself due to SI. EMS states PD was on scene on their arrival and they were told pt did not want to live at his home care residence anymore. Pt states he is also "hearing voices" saying "bad things." Pt with significant hx schizophrenia.   Past Medical History:  Diagnosis Date  . Bipolar 1 disorder (HCC) 06/26/2016  . Diabetes mellitus without complication (HCC)   . Hypertension   . Schizophrenia (HCC)   . Seizures [facility])     Patient Active Problem List   Diagnosis Date Noted  . Diabetes mellitus without complication (HCC) 06/27/2016  . Schizophrenia (HCC) 09/01/2015  . High blood pressure 09/01/2015  . Seizure disorder (HCC) 09/01/2015    Past Surgical History:  Procedure Laterality Date  . APPENDECTOMY         Home Medications    Prior to Admission medications   Medication Sig Start Date End Date Taking? Authorizing Provider  benztropine (COGENTIN) 1 MG tablet Take 1 mg by mouth 2 (two) times daily.    Historical Provider, MD  diphenhydrAMINE (BENADRYL) 25 MG tablet Take 25 mg by mouth at bedtime.    Historical Provider, MD  gabapentin (NEURONTIN) 300 MG capsule Take 300 mg by mouth 2 (two) times daily.    Historical Provider, MD  LORazepam (ATIVAN) 1 MG tablet Take 1 mg by mouth every 6 (six) hours as needed for anxiety.    Historical Provider, MD  metFORMIN (GLUCOPHAGE) 500 MG tablet Take 500 mg by mouth 2 (two) times daily with a meal.    Historical Provider, MD  methocarbamol (ROBAXIN) 500 MG tablet Take 500 mg  by mouth 3 (three) times daily.    Historical Provider, MD  omeprazole (PRILOSEC) 20 MG capsule Take 40 mg by mouth 2 (two) times daily.    Historical Provider, MD  prazosin (MINIPRESS) 1 MG capsule Take 1 mg by mouth 2 (two) times daily.    Historical Provider, MD  propranolol (INDERAL) 20 MG tablet Take 20 mg by mouth 3 (three) times daily.    Historical Provider, MD  Skin Protectants, Misc. (EUCERIN) cream Apply 1 application topically 2 (two) times daily.    Historical Provider, MD    Family History History reviewed. No pertinent family history.  Social History Social History  Substance Use Topics  . Smoking status: Current Every Day Smoker    Packs/day: 1.00  . Smokeless tobacco: Never Used  . Alcohol use No     Allergies   Patient has no known allergies.   Review of Systems Review of Systems  Unable to perform ROS: Psychiatric disorder     Physical Exam Updated Vital Signs BP (!) 102/58 (BP Location: Right Arm)   Pulse 65   Temp 97.6 F (36.4 C) (Oral)   Resp 20   Physical Exam 1410: Physical examination:  Nursing notes reviewed; Vital signs and O2 SAT reviewed;  Constitutional: Well developed, Well nourished, Well hydrated, In no acute distress;  Head:  Normocephalic, atraumatic; Eyes: EOMI, PERRL, No scleral icterus; ENMT: Mouth and pharynx normal, Mucous membranes moist; Neck: Supple, Full range of motion; Cardiovascular: Regular rate and rhythm; Respiratory: Breath sounds clear, No wheezes.  Speaking full sentences with ease, Normal respiratory effort/excursion; Chest: No deformity, Movement normal; Abdomen: Nondistended; Extremities: No deformity.; Neuro: AA&Ox3, Major CN grossly intact.  Speech clear. No gross focal motor deficits in extremities. Climbs on and off stretcher easily by himself. Gait steady.; Skin: Color normal, Warm, Dry.; Psych:  Affect flat.     ED Treatments / Results  Labs (all labs ordered are listed, but only abnormal results are  displayed)   EKG  EKG Interpretation None       Radiology   Procedures Procedures (including critical care time)  Medications Ordered in ED Medications - No data to display   Initial Impression / Assessment and Plan / ED Course  I have reviewed the triage vital signs and the nursing notes.  Pertinent labs & imaging results that were available during my care of the patient were reviewed by me and considered in my medical decision making (see chart for details).  MDM Reviewed: nursing note, vitals and previous chart Reviewed previous: labs Interpretation: labs   Results for orders placed or performed during the hospital encounter of 03/29/17  CBC with Differential  Result Value Ref Range   WBC 4.0 4.0 - 10.5 K/uL   RBC 4.26 4.22 - 5.81 MIL/uL   Hemoglobin 13.9 13.0 - 17.0 g/dL   HCT 19.1 47.8 - 29.5 %   MCV 91.8 78.0 - 100.0 fL   MCH 32.6 26.0 - 34.0 pg   MCHC 35.5 30.0 - 36.0 g/dL   RDW 62.1 30.8 - 65.7 %   Platelets 125 (L) 150 - 400 K/uL   Neutrophils Relative % 39 %   Neutro Abs 1.5 (L) 1.7 - 7.7 K/uL   Lymphocytes Relative 46 %   Lymphs Abs 1.8 0.7 - 4.0 K/uL   Monocytes Relative 14 %   Monocytes Absolute 0.6 0.1 - 1.0 K/uL   Eosinophils Relative 1 %   Eosinophils Absolute 0.1 0.0 - 0.7 K/uL   Basophils Relative 0 %   Basophils Absolute 0.0 0.0 - 0.1 K/uL  Basic metabolic panel  Result Value Ref Range   Sodium 131 (L) 135 - 145 mmol/L   Potassium 4.5 3.5 - 5.1 mmol/L   Chloride 96 (L) 101 - 111 mmol/L   CO2 29 22 - 32 mmol/L   Glucose, Bld 96 65 - 99 mg/dL   BUN 11 6 - 20 mg/dL   Creatinine, Ser 8.46 0.61 - 1.24 mg/dL   Calcium 9.2 8.9 - 96.2 mg/dL   GFR calc non Af Amer >60 >60 mL/min   GFR calc Af Amer >60 >60 mL/min   Anion gap 6 5 - 15  Ethanol  Result Value Ref Range   Alcohol, Ethyl (B) <5 <5 mg/dL  Urine rapid drug screen (hosp performed)  Result Value Ref Range   Opiates NONE DETECTED NONE DETECTED   Cocaine NONE DETECTED NONE  DETECTED   Benzodiazepines POSITIVE (A) NONE DETECTED   Amphetamines NONE DETECTED NONE DETECTED   Tetrahydrocannabinol NONE DETECTED NONE DETECTED   Barbiturates NONE DETECTED NONE DETECTED    1440:  Will have TTS evaluate.     Final Clinical Impressions(s) / ED Diagnoses   Final diagnoses:  None    New Prescriptions New Prescriptions   No medications on file  Samuel Jester, DO 03/29/17 1544

## 2017-03-30 DIAGNOSIS — F17210 Nicotine dependence, cigarettes, uncomplicated: Secondary | ICD-10-CM

## 2017-03-30 DIAGNOSIS — R45851 Suicidal ideations: Secondary | ICD-10-CM

## 2017-03-30 DIAGNOSIS — F209 Schizophrenia, unspecified: Secondary | ICD-10-CM

## 2017-03-30 NOTE — ED Provider Notes (Signed)
Psych team recommends discharging the patient. Patient has been staffed with Dr. Lucianne Muss according to the note from the behavioral health team.  Vitals:   03/29/17 1900 03/30/17 0512  BP: 124/73 132/71  Pulse: 71 99  Resp:  17  Temp: 97.5 F (36.4 C) 98.2 F (36.8 C)    Pt has no acute medical issues. Stable for d/c to the group home.    Derwood Kaplan, MD 03/30/17 1312

## 2017-03-30 NOTE — Discharge Instructions (Signed)
We saw you in the ER for your mental health concerns and had our behavioral health team evaluate you. The team feels comfortable sending you home, please follow the recommendations given to you by them and the follow-up and medications they have prescribed to you.  Follow up with outpatient therapy/counseling and medication management

## 2017-03-30 NOTE — ED Notes (Signed)
Contacted group home to come pick pt up

## 2017-03-30 NOTE — Consult Note (Signed)
Telepsych Consultation   Reason for Consult:  Paranoid Schizophrenia with Suicidal ideation Referring Physician:  EDP Patient Identification: Jeremy Schultz MRN:  [id number] Principal Diagnosis: Schizophrenia [facility])  Diagnosis:   Patient Active Problem List   Diagnosis Date Noted  . Schizophrenia ([place]) [F20.9] 09/01/2015    Priority: High  . Diabetes mellitus without complication ([place]) [T36.4] 06/27/2016  . High blood pressure [I10] 09/01/2015  . Seizure disorder ([place]) [W80.321] 09/01/2015    Total Time spent with patient: 30 minutes  Subjective:   Jeremy Schultz is a 46 y.o. male patient admitted with suicidal ideation.  HPI:  Per note on chart written by Ruben Reason: [facility] Counselor: Pt denies SI, HI, and is not hearing the devil today, however, he is still paranoid. Pt did not sleep well last night but did eat well and would like more food. Pt was tearful, reporting that he missed his family who died when he was 89. [place] not want to go back to his group home.   Pt was run by Charlann Noss, NP, who determined the Pt no longer met inpatient criteria.   Clinician called [facility] at [phone] to confirm the Pt could return. [facility] agreed to send transportation and to lock up all knives.   Today during tele psych consult:   I have reviewed and concur with HPI elements above, modified as follows:   Jeremy Schultz is a 46 year old male who presented to the [facility] from his group home after picking up a knife and threatening  to cut his wrist. The group home worker was able to talk the patient into giving her the knife. Today,  Pt denies suicidal/homicidal ideation, denies auditory/visual hallucinations and does not appear to be responding to internal stimuli. Pt was tearful during the interview and stating "I miss my Mom and Dad and I had a bad dream last night." Pt stated yesterday that he does not like his  group home and that is why he wanted to hurt himself (per note written by Marinus Maw, [facility] Counselor) Upon chart review it appears as if Pt has a significant history of schizophrenia and suicidal ideation. Pt often frequents the local ED's stating he is suicidal but then states he just wanted to be away from the group home.    Discussed case with Dr Dwyane Dee who recommends that Pt be discharged to group home with safety plan in place.   Past Psychiatric History: Schizophrenia, Seizure disorder  Risk to Self: Suicidal Ideation: Yes-Currently Present Suicidal Intent: Yes-Currently Present Is patient at risk for suicide?: Yes Suicidal Plan?: Yes-Currently Present Specify Current Suicidal Plan: to cut wrist Access to Means: Yes Specify Access to Suicidal Means: had knife ealier today What has been your use of drugs/alcohol within the last 12 months?: n/a How many times?: 0 Intentional Self Injurious Behavior: None Risk to Others: Homicidal Ideation: No Thoughts of Harm to Others: No Current Homicidal Intent: No Current Homicidal Plan: No Access to Homicidal Means: No History of harm to others?: No Does patient have access to weapons?: No Criminal Charges Pending?: No Does patient have a court date: No Prior Inpatient Therapy:   Prior Outpatient Therapy: Prior Outpatient Therapy: Yes Prior Therapy Dates: ongoing Prior Therapy Facilty/Provider(s): NP Reason for Treatment: schizophrenia Does patient have an ACCT team?: No Does patient have Intensive In-House Services?  : No Does patient have [facility] services? : No Does patient have  P4CC services?: No  Past Medical History:  Past Medical History:  Diagnosis Date  . Bipolar 1 disorder ([place]) 06/26/2016  . Diabetes mellitus without complication ([place])   . Hypertension   . Schizophrenia ([place])   . Seizures ([place])     Past Surgical History:  Procedure Laterality Date  . APPENDECTOMY     Family History: History reviewed. No pertinent  family history. Family Psychiatric  History: Unknown Social History:  History  Alcohol Use No     History  Drug Use No    Social History   Social History  . Marital status: Single    Spouse name: N/A  . Number of children: N/A  . Years of education: N/A   Social History Main Topics  . Smoking status: Current Every Day Smoker    Packs/day: 1.00  . Smokeless tobacco: Never Used  . Alcohol use No  . Drug use: No  . Sexual activity: Not Asked   Other Topics Concern  . None   Social History Narrative  . None   Additional Social History:    Allergies:  No Known Allergies  Labs:  Results for orders placed or performed during the hospital encounter of 03/29/17 (from the past 48 hour(s))  CBC with Differential     Status: Abnormal   Collection Time: 03/29/17  1:47 PM  Result Value Ref Range   WBC 4.0 4.0 - 10.5 K/uL   RBC 4.26 4.22 - 5.81 MIL/uL   Hemoglobin 13.9 13.0 - 17.0 g/dL   HCT 39.1 39.0 - 52.0 %   MCV 91.8 78.0 - 100.0 fL   MCH 32.6 26.0 - 34.0 pg   MCHC 35.5 30.0 - 36.0 g/dL   RDW 13.3 11.5 - 15.5 %   Platelets 125 (L) 150 - 400 K/uL   Neutrophils Relative % 39 %   Neutro Abs 1.5 (L) 1.7 - 7.7 K/uL   Lymphocytes Relative 46 %   Lymphs Abs 1.8 0.7 - 4.0 K/uL   Monocytes Relative 14 %   Monocytes Absolute 0.6 0.1 - 1.0 K/uL   Eosinophils Relative 1 %   Eosinophils Absolute 0.1 0.0 - 0.7 K/uL   Basophils Relative 0 %   Basophils Absolute 0.0 0.0 - 0.1 K/uL  Basic metabolic panel     Status: Abnormal   Collection Time: 03/29/17  1:47 PM  Result Value Ref Range   Sodium 131 (L) 135 - 145 mmol/L   Potassium 4.5 3.5 - 5.1 mmol/L   Chloride 96 (L) 101 - 111 mmol/L   CO2 29 22 - 32 mmol/L   Glucose, Bld 96 65 - 99 mg/dL   BUN 11 6 - 20 mg/dL   Creatinine, Ser 0.86 0.61 - 1.24 mg/dL   Calcium 9.2 8.9 - 10.3 mg/dL   GFR calc non Af Amer >60 >60 mL/min   GFR calc Af Amer >60 >60 mL/min    Comment: (NOTE) The eGFR has been calculated using the CKD EPI  equation. This calculation has not been validated in all clinical situations. eGFR's persistently <60 mL/min signify possible Chronic Kidney Disease.    Anion gap 6 5 - 15  Urine rapid drug screen (hosp performed)     Status: Abnormal   Collection Time: 03/29/17  1:51 PM  Result Value Ref Range   Opiates NONE DETECTED NONE DETECTED   Cocaine NONE DETECTED NONE DETECTED   Benzodiazepines POSITIVE (A) NONE DETECTED   Amphetamines NONE DETECTED NONE DETECTED   Tetrahydrocannabinol NONE DETECTED NONE  DETECTED   Barbiturates NONE DETECTED NONE DETECTED    Comment:        DRUG SCREEN FOR MEDICAL PURPOSES ONLY.  IF CONFIRMATION IS NEEDED FOR ANY PURPOSE, NOTIFY LAB WITHIN 5 DAYS.        LOWEST DETECTABLE LIMITS FOR URINE DRUG SCREEN Drug Class       Cutoff (ng/mL) Amphetamine      1000 Barbiturate      200 Benzodiazepine   287 Tricyclics       867 Opiates          300 Cocaine          300 THC              50   Ethanol     Status: None   Collection Time: 03/29/17  1:52 PM  Result Value Ref Range   Alcohol, Ethyl (B) <5 <5 mg/dL    Comment:        LOWEST DETECTABLE LIMIT FOR SERUM ALCOHOL IS 5 mg/dL FOR MEDICAL PURPOSES ONLY     No current facility-administered medications for this encounter.    Current Outpatient Prescriptions  Medication Sig Dispense Refill  . acetaminophen (TYLENOL) 325 MG tablet Take 650 mg by mouth every 6 (six) hours as needed for mild pain or moderate pain.    . benztropine (COGENTIN) 1 MG tablet Take 1 mg by mouth 2 (two) times daily.    . cetirizine (ZYRTEC) 10 MG tablet Take 10 mg by mouth at bedtime.    . citalopram (CELEXA) 40 MG tablet Take 40 mg by mouth every morning.    . clonazePAM (KLONOPIN) 0.5 MG tablet Take 0.5 mg by mouth 3 (three) times daily.    . divalproex (DEPAKOTE) 500 MG DR tablet Take 1,000 mg by mouth 2 (two) times daily.    Marland Kitchen donepezil (ARICEPT) 5 MG tablet Take 5 mg by mouth at bedtime.    . gabapentin (NEURONTIN) 300 MG  capsule Take 300 mg by mouth 2 (two) times daily.    . haloperidol (HALDOL) 5 MG tablet Take 5 mg by mouth every morning. *May take one additional tablet daily as needed for Schizophrenia    . metFORMIN (GLUCOPHAGE) 500 MG tablet Take 500 mg by mouth 2 (two) times daily with a meal.    . methocarbamol (ROBAXIN) 500 MG tablet Take 500 mg by mouth 3 (three) times daily.    Marland Kitchen omeprazole (PRILOSEC) 20 MG capsule Take 40 mg by mouth 2 (two) times daily.    . prazosin (MINIPRESS) 1 MG capsule Take 1 mg by mouth 2 (two) times daily.    . propranolol (INDERAL) 20 MG tablet Take 20 mg by mouth 3 (three) times daily.      Musculoskeletal: Unable to assess: camera  Psychiatric Specialty Exam: Physical Exam  Constitutional: He is oriented to person, place, and time. He appears well-developed and well-nourished.  HENT:  Head: Normocephalic.  Neurological: He is alert and oriented to person, place, and time.    Review of Systems  Psychiatric/Behavioral: Positive for depression and suicidal ideas. Negative for hallucinations, memory loss and substance abuse. The patient is not nervous/anxious and does not have insomnia.     Blood pressure 132/71, pulse 99, temperature 98.2 F (36.8 C), temperature source Oral, resp. rate 17, SpO2 96 %.There is no height or weight on file to calculate BMI.  General Appearance: Casual  Eye Contact:  Fair  Speech:  Clear and Coherent and Slow  Volume:  Normal  Mood:  Depressed  Affect:  Congruent, Depressed and Tearful  Thought Process:  Coherent  Orientation:  Full (Time, Place, and Person)  Thought Content:  Ideas of Reference:   Paranoia, Obsessions and Rumination  Suicidal Thoughts:  Yes.  without intent/plan  Homicidal Thoughts:  No  Memory:  Immediate;   Good Recent;   Fair Remote;   Fair  Judgement:  Poor  Insight:  Lacking  Psychomotor Activity:  Normal  Concentration:  Concentration: Fair and Attention Span: Fair  Recall:  [employer] of Knowledge:   Fair  Language:  Good  Akathisia:  No  Handed:  Right  AIMS (if indicated):     Assets:  Agricultural consultant Housing Resilience Social Support  ADL's:  Intact  Cognition:  WNL  Sleep:        Treatment Plan Summary: Discharge to group home  Take all medications as prescribed Follow up with PCP for any new or existing medical issues Activity as tolerated Follow up with outpatient therapy/counseling and medication management   Disposition: No evidence of imminent risk to self or others at present.   Supportive therapy provided about ongoing stressors. Discussed crisis plan, support from social network, calling 911, coming to the Emergency Department, and calling Suicide Hotline.  Ethelene Hal, NP 03/30/2017 12:24 PM

## 2017-03-30 NOTE — ED Notes (Signed)
Lunch tray given. 

## 2017-03-30 NOTE — ED Notes (Signed)
Spoke with Corrie Dandy at [facility],  She states pt no longer meets criteria for inpatient placement.   EDP notified

## 2017-03-30 NOTE — BH Assessment (Signed)
Pt denies SI, HI, and is not hearing the devil today, however, he is still paranoid. Pt did not sleep well last night but did eat well and would like more food. Pt was tearful, reporting that he missed his family who died when he was 67. [place] not want to go back to his group home.   Pt was run by Charlann Noss, NP, who determined the Pt no longer met inpatient criteria.   Clinician called [facility] at [phone] to confirm the Pt could return. [facility] agreed to send transportation and to lock up all knives.   Clinician spoke with EDP at [facility] who requested the Pt be seen by an MD. Clinician notified Charlann Noss, NP, of the request.   Ruben Reason, MA, LPCA, [place] Therapeutic Triage Specialist [facility]

## 2017-04-09 ENCOUNTER — Emergency Department (HOSPITAL_COMMUNITY)
Admission: EM | Admit: 2017-04-09 | Discharge: 2017-04-10 | Disposition: A | Discharge status: Home / Self Care | Payer: Medicaid Other | Attending: Emergency Medicine | Admitting: Emergency Medicine

## 2017-04-09 ENCOUNTER — Encounter (HOSPITAL_COMMUNITY): Payer: Self-pay | Admitting: Emergency Medicine

## 2017-04-09 DIAGNOSIS — Z79899 Other long term (current) drug therapy: Secondary | ICD-10-CM | POA: Insufficient documentation

## 2017-04-09 DIAGNOSIS — Z7984 Long term (current) use of oral hypoglycemic drugs: Secondary | ICD-10-CM | POA: Diagnosis not present

## 2017-04-09 DIAGNOSIS — F209 Schizophrenia, unspecified: Secondary | ICD-10-CM | POA: Insufficient documentation

## 2017-04-09 DIAGNOSIS — I10 Essential (primary) hypertension: Secondary | ICD-10-CM | POA: Insufficient documentation

## 2017-04-09 DIAGNOSIS — F17200 Nicotine dependence, unspecified, uncomplicated: Secondary | ICD-10-CM | POA: Diagnosis not present

## 2017-04-09 DIAGNOSIS — E119 Type 2 diabetes mellitus without complications: Secondary | ICD-10-CM | POA: Diagnosis not present

## 2017-04-09 DIAGNOSIS — Z046 Encounter for general psychiatric examination, requested by authority: Secondary | ICD-10-CM | POA: Diagnosis present

## 2017-04-09 LAB — COMPREHENSIVE METABOLIC PANEL
ALBUMIN: 3.3 g/dL — AB (ref 3.5–5.0)
ALT: 20 U/L (ref 17–63)
ANION GAP: 8 (ref 5–15)
AST: 28 U/L (ref 15–41)
Alkaline Phosphatase: 32 U/L — ABNORMAL LOW (ref 38–126)
BILIRUBIN TOTAL: 0.5 mg/dL (ref 0.3–1.2)
BUN: 9 mg/dL (ref 6–20)
CO2: 27 mmol/L (ref 22–32)
Calcium: 9.3 mg/dL (ref 8.9–10.3)
Chloride: 94 mmol/L — ABNORMAL LOW (ref 101–111)
Creatinine, Ser: 0.76 mg/dL (ref 0.61–1.24)
GFR calc Af Amer: >60 mL/min (ref >60)
GFR calc non Af Amer: >60 mL/min (ref >60)
GLUCOSE: 130 mg/dL — AB (ref 65–99)
POTASSIUM: 4.1 mmol/L (ref 3.5–5.1)
SODIUM: 129 mmol/L — AB (ref 135–145)
TOTAL PROTEIN: 6.7 g/dL (ref 6.5–8.1)

## 2017-04-09 LAB — CBC WITH DIFFERENTIAL/PLATELET
BASOS PCT: 0 %
Basophils Absolute: 0 10*3/uL (ref 0.0–0.1)
Eosinophils Absolute: 0 10*3/uL (ref 0.0–0.7)
Eosinophils Relative: 1 %
HEMATOCRIT: 36.8 % — AB (ref 39.0–52.0)
Hemoglobin: 13.1 g/dL (ref 13.0–17.0)
LYMPHS ABS: 1.7 10*3/uL (ref 0.7–4.0)
LYMPHS PCT: 42 %
MCH: 32.3 pg (ref 26.0–34.0)
MCHC: 35.6 g/dL (ref 30.0–36.0)
MCV: 90.9 fL (ref 78.0–100.0)
MONOS PCT: 9 %
Monocytes Absolute: 0.4 10*3/uL (ref 0.1–1.0)
NEUTROS ABS: 2 10*3/uL (ref 1.7–7.7)
Neutrophils Relative %: 48 %
Platelets: 128 10*3/uL — ABNORMAL LOW (ref 150–400)
RBC: 4.05 MIL/uL — ABNORMAL LOW (ref 4.22–5.81)
RDW: 13 % (ref 11.5–15.5)
WBC: 4 10*3/uL (ref 4.0–10.5)

## 2017-04-09 LAB — ETHANOL: Alcohol, Ethyl (B): <5 mg/dL (ref <5)

## 2017-04-09 NOTE — ED Triage Notes (Signed)
PT brought in by [facility] from [facility] [phone]) for SI/HI that started today. PT states he doesn't want to go back to the group home. PT tearful on arrival to ED.

## 2017-04-09 NOTE — ED Provider Notes (Signed)
[facility] DE[facility] Provider Note   CSN: [id number] Arrival date & time: 04/09/17  1413     History   Chief Complaint Chief Complaint  Patient presents with  . V70.1    HPI Jeremy Schultz is a 46 y.o. male.  HPI  46 year old male with history of bipolar, schizophrenia, hypertension, diabetes presents to the emergency department today with suicidal homicidal ideation secondary to supposed auditory hallucinations but seems to be more likely related to dissatisfaction with his current living situation. He has no medical complaints at this time. Has a plan to choke himself but no plans for homicide.   Past Medical History:  Diagnosis Date  . Bipolar 1 disorder (HCC) 06/26/2016  . Diabetes mellitus without complication (HCC)   . Hypertension   . Schizophrenia (HCC)   . Seizures [facility](HCC)     Patient Active Problem List   Diagnosis Date Noted  . Diabetes mellitus without complication (HCC) 06/27/2016  . Schizophrenia (HCC) 09/01/2015  . High blood pressure 09/01/2015  . Seizure disorder (HCC) 09/01/2015    Past Surgical History:  Procedure Laterality Date  . APPENDECTOMY         Home Medications    Prior to Admission medications   Medication Sig Start Date End Date Taking? Authorizing Provider  acetaminophen (TYLENOL) 325 MG tablet Take 650 mg by mouth every 6 (six) hours as needed for mild pain or moderate pain.   Yes [provider]  benztropine (COGENTIN) 1 MG tablet Take 1 mg by mouth 2 (two) times daily.   Yes [provider]  diphenhydrAMINE (BENADRYL) 25 mg capsule Take 25 mg by mouth at bedtime.   Yes [provider]  gabapentin (NEURONTIN) 300 MG capsule Take 300 mg by mouth 2 (two) times daily.   Yes [provider]  LORazepam (ATIVAN) 1 MG tablet Take 1 mg by mouth 4 (four) times daily.   Yes [provider]  metFORMIN (GLUCOPHAGE) 500 MG tablet Take 500 mg by mouth 2 (two) times daily with a meal.   Yes [provider]  methocarbamol (ROBAXIN) 500 MG tablet Take 500 mg by mouth 3 (three) times daily.   Yes [provider]  neomycin-bacitracin-polymyxin (NEOSPORIN) ointment Apply 1 application topically as needed for wound care. apply to eye   Yes [provider]  omeprazole (PRILOSEC) 40 MG capsule Take 40 mg by mouth daily.   Yes [provider]  prazosin (MINIPRESS) 1 MG capsule Take 1 mg by mouth 2 (two) times daily.   Yes [provider]  propranolol (INDERAL) 20 MG tablet Take 20 mg by mouth 3 (three) times daily.   Yes [provider]  Skin Protectants, Misc. (EUCERIN) cream Apply 1 application topically 2 (two) times daily.   Yes [provider]  citalopram (CELEXA) 40 MG tablet Take 40 mg by mouth every morning.    [provider]  donepezil (ARICEPT) 5 MG tablet Take 5 mg by mouth at bedtime.    [provider]  haloperidol (HALDOL) 5 MG tablet Take 5 mg by mouth every morning. *May take one additional tablet daily as needed for Schizophrenia    [provider]    Family History History reviewed. No pertinent family history.  Social History Social History  Substance Use Topics  . Smoking status: Current Every Day Smoker    Packs/day: 1.00  . Smokeless tobacco: Never Used  . Alcohol use No     Allergies   Patient has no  known allergies.   Review of Systems Review of Systems  All other systems reviewed and are negative.    Physical Exam Updated Vital Signs BP 127/71 (BP Location: Left Arm)   Pulse 68   Resp 18   Ht 5\' 7"  (1.702 m)   Wt 175 lb (79.4 kg)   SpO2 97%   BMI 27.41 kg/m   Physical Exam  Constitutional: He is oriented to person, place, and time. He appears well-developed and well-nourished.  HENT:  Head: Normocephalic and atraumatic.  Eyes: Conjunctivae and EOM are normal.  Neck: Normal range of motion.  Cardiovascular: Normal rate.   Pulmonary/Chest: Effort normal. No  respiratory distress.  Abdominal: Soft. He exhibits no distension.  Musculoskeletal: Normal range of motion. He exhibits no edema or deformity.  Neurological: He is alert and oriented to person, place, and time.  Skin: Skin is warm and dry. No rash noted.  Psychiatric: His mood appears anxious. He exhibits a depressed mood. He expresses homicidal and suicidal ideation. He expresses suicidal plans. He expresses no homicidal plans.  Nursing note and vitals reviewed.    ED Treatments / Results  Labs (all labs ordered are listed, but only abnormal results are displayed) Labs Reviewed  COMPREHENSIVE METABOLIC PANEL  ETHANOL  CBC WITH DIFFERENTIAL/PLATELET  RAPID URINE DRUG SCREEN, HOSP PERFORMED    EKG  EKG Interpretation None       Radiology No results found.  Procedures Procedures (including critical care time)  Medications Ordered in ED Medications - No data to display   Initial Impression / Assessment and Plan / ED Course  I have reviewed the triage vital signs and the nursing notes.  Pertinent labs & imaging results that were available during my care of the patient were reviewed by me and considered in my medical decision making (see chart for details).    SI/HI, will medically clear.   Medically cleared for TTS consult.   Final Clinical Impressions(s) / ED Diagnoses   Final diagnoses:  None     Shauntea Lok, Barbara Cower, MD 04/10/17 1442

## 2017-04-10 DIAGNOSIS — R45851 Suicidal ideations: Secondary | ICD-10-CM | POA: Insufficient documentation

## 2017-04-10 DIAGNOSIS — Z79899 Other long term (current) drug therapy: Secondary | ICD-10-CM

## 2017-04-10 DIAGNOSIS — F17210 Nicotine dependence, cigarettes, uncomplicated: Secondary | ICD-10-CM

## 2017-04-10 DIAGNOSIS — F209 Schizophrenia, unspecified: Secondary | ICD-10-CM | POA: Diagnosis not present

## 2017-04-10 LAB — RAPID URINE DRUG SCREEN, HOSP PERFORMED
AMPHETAMINES: NOT DETECTED
BENZODIAZEPINES: NOT DETECTED
Barbiturates: NOT DETECTED
Cocaine: NOT DETECTED
OPIATES: NOT DETECTED
Tetrahydrocannabinol: NOT DETECTED

## 2017-04-10 NOTE — ED Notes (Signed)
Spoke with Dr Ranae PalmsYelverton about ordering pts regular medication.  MD to review.

## 2017-04-10 NOTE — BH Assessment (Signed)
Tele Assessment Note   Jeremy Schultz is an 46 y.o. male, Caucasian who presents to [facility] ED per ed report: with history of bipolar, schizophrenia, hypertension, diabetes presents to the emergency department today with suicidal homicidal ideation secondary to supposed auditory hallucinations but seems to be more likely related to dissatisfaction with his current living situation. He has no medical complaints at this time. Has a plan to choke himself but no plans for homicide.  Patient states primary concern does not like it where he is living. Patient states that he lives in [facility], and that he has decline in sleep with 4 hours at most sleep per night. Patient has hx. Of hospital visits with similar issues such as claiming SI and AVH. Patient recommended then released not committed for inpatient psych  several times at [facility] and at [facility] from 2016-2018. Patient denies current SI and HI. Patient acknowledges current AH with command voice [not specified]. Patient denies hx. Of S.A. Patient denies hx. If inpatient psych care. Patient denies current outpatient care, but per last TTS /MAR was seen outpatient with NP provider. Patient is dressed in scrubs and is alert and oriented x4. Patient speech was within normal limits and motor behavior appeared normal. Patient thought process is coherent. Patient does not appear to be responding to internal stimuli. Patient was cooperative throughout the assessment and states that he  is agreeable to inpatient psychiatric treatment.  Diagnosis: Bipolar 1 Disorder with psychotic features  Past Medical History:  Past Medical History:  Diagnosis Date  . Bipolar 1 disorder (HCC) 06/26/2016  . Diabetes mellitus without complication (HCC)   . Hypertension   . Schizophrenia (HCC)   . Seizures (HCC)     Past Surgical History:  Procedure Laterality Date  . APPENDECTOMY      Family History: History reviewed. No pertinent family  history.  Social History:  reports that he has been smoking.  He has been smoking about 1.00 pack per day. He has never used smokeless tobacco. He reports that he does not drink alcohol or use drugs.  Additional Social History:  Alcohol / Drug Use Pain Medications: SEE MAR Prescriptions: SEE MAR Over the Counter: SEE MAR History of alcohol / drug use?: No history of alcohol / drug abuse  CIWA: CIWA-Ar BP: 130/75 Pulse Rate: 67 COWS:    PATIENT STRENGTHS: (choose at least two) Ability for insight Communication skills General fund of knowledge  Allergies: No Known Allergies  Home Medications:  (Not in a hospital admission)  OB/GYN Status:  No LMP for male patient.  General Assessment Data Location of Assessment: [facility] ED TTS Assessment: In system Is this a Tele or Face-to-Face Assessment?: Tele Assessment Is this an Initial Assessment or a Re-assessment for this encounter?: Initial Assessment Marital status: Single Is patient pregnant?: No Pregnancy Status: No Living Arrangements: Group Home Can pt return to current living arrangement?: Yes Admission Status: Voluntary Is patient capable of signing voluntary admission?: Yes Referral Source: Self/Family/Friend Insurance type: MCD  Medical Screening Exam [facility] Walk-in ONLY) Medical Exam completed: Yes  Crisis Care Plan Living Arrangements: Group Home Legal Guardian: Other: Name of Psychiatrist: n/a Name of Therapist: n/a  Education Status Is patient currently in school?: No Current Grade: n/a Highest grade of school patient has completed: unknown Name of school: n/a Contact person: [facility]  Risk to self with the past 6 months Suicidal Ideation: No Has patient been a risk to self within the past 6 months  prior to admission? : No Suicidal Intent: No Has patient had any suicidal intent within the past 6 months prior to admission? : No Is patient at risk for suicide?: Yes Suicidal Plan?: No Has patient  had any suicidal plan within the past 6 months prior to admission? : No Specify Current Suicidal Plan: none Access to Means: No Specify Access to Suicidal Means: none current What has been your use of drugs/alcohol within the last 12 months?: n/a Previous Attempts/Gestures: No How many times?: 0 Other Self Harm Risks: none Triggers for Past Attempts: Unknown Intentional Self Injurious Behavior: None Family Suicide History: Unknown Recent stressful life event(s): Other (Comment) Persecutory voices/beliefs?: No Depression: Yes Depression Symptoms: Isolating, Fatigue, Loss of interest in usual pleasures Substance abuse history and/or treatment for substance abuse?: No Suicide prevention information given to non-admitted patients: Not applicable  Risk to Others within the past 6 months Homicidal Ideation: No Does patient have any lifetime risk of violence toward others beyond the six months prior to admission? : No Thoughts of Harm to Others: No Current Homicidal Intent: No Current Homicidal Plan: No Access to Homicidal Means: No Identified Victim: none History of harm to others?: No Assessment of Violence: None Noted Violent Behavior Description: n/a Does patient have access to weapons?: No Criminal Charges Pending?: No Does patient have a court date: No Is patient on probation?: No  Psychosis Hallucinations: Auditory, With command Delusions: None noted  Mental Status Report Appearance/Hygiene: Unremarkable Eye Contact: Fair Motor Activity: Freedom of movement Speech: Logical/coherent Level of Consciousness: Drowsy Mood: Depressed Affect: Depressed Anxiety Level: Moderate Thought Processes: Relevant Judgement: Unimpaired Orientation: Person, Place, Time, Situation, Appropriate for developmental age Obsessive Compulsive Thoughts/Behaviors: Moderate  Cognitive Functioning Concentration: Decreased Memory: Recent Intact, Remote Intact IQ: Average Insight:  Poor Impulse Control: Poor Appetite: Fair Weight Loss: 0 Weight Gain: 0 Sleep: Decreased Total Hours of Sleep: 4 Vegetative Symptoms: None  ADLScreening [facility] Assessment Services) Patient's cognitive ability adequate to safely complete daily activities?: Yes Patient able to express need for assistance with ADLs?: Yes Independently performs ADLs?: Yes (appropriate for developmental age)  Prior Inpatient Therapy Prior Inpatient Therapy: No Prior Therapy Dates: n/a  has been seen not admitted Prior Therapy Facilty/Provider(s): n/a Reason for Treatment: n/a  Prior Outpatient Therapy Prior Outpatient Therapy: Yes Prior Therapy Dates: ongoing Prior Therapy Facilty/Provider(s): NP Reason for Treatment: schizophrenia Does patient have an ACCT team?: No Does patient have Intensive In-House Services?  : No Does patient have [facility] services? : No Does patient have P4CC services?: No  ADL Screening (condition at time of admission) Patient's cognitive ability adequate to safely complete daily activities?: Yes Is the patient deaf or have difficulty hearing?: No Does the patient have difficulty seeing, even when wearing glasses/contacts?: No Does the patient have difficulty concentrating, remembering, or making decisions?: No Patient able to express need for assistance with ADLs?: Yes Does the patient have difficulty dressing or bathing?: No Independently performs ADLs?: Yes (appropriate for developmental age) Does the patient have difficulty walking or climbing stairs?: No Weakness of Legs: None Weakness of Arms/Hands: None       Abuse/Neglect Assessment (Assessment to be complete while patient is alone) Physical Abuse: Denies Verbal Abuse: Denies Sexual Abuse: Denies Exploitation of patient/patient's resources: Denies Self-Neglect: Denies Values / Beliefs Cultural Requests During Hospitalization: None Spiritual Requests During Hospitalization: None   Advance Directives (For  Healthcare) Does Patient Have a Medical Advance Directive?: No Would patient like information on creating a medical advance directive?: No -  Patient declined    Additional Information 1:1 In Past 12 Months?: No CIRT Risk: No Elopement Risk: No Does patient have medical clearance?: Yes     Disposition: Per Donell SievertSpencer, Simon, PA recommend a.m. Psych evaluation Disposition Initial Assessment Completed for this Encounter: Yes Disposition of Patient: Other dispositions (TBD)  Hanni Milford K Yen Wandell 04/10/2017 6:06 AM

## 2017-04-10 NOTE — Progress Notes (Signed)
CSW received request for [facility] ED Nurse, to contact group home.  Group home had been contacted earlier and [facility] Nursing were told that the group home would not pick patient up "and did not want him back.".    CSW called the number that is listed as patient's home number and spoke to [place], who identified herself as staff for [facility].  CSW told her that pt.was being d/c'd and was ready to be picked up.  Harriett Sineancy said that someone would be there to pick pt up within 30 minutes.   CSW also left a voicemail for a Purvis SheffieldDarryl Moore, who is listed as the patient's emergency contact [phone]). It is unclear to this Clinical research associatewriter who Mr. Christell ConstantMoore is in relation to patient.  Timmothy EulerJean T. Kaylyn LimSutter, MSW, LCSWA Clinical Social Work Disposition [phone]   .  .Marland Kitchen

## 2017-04-10 NOTE — ED Notes (Signed)
[facility] recommending [facility].

## 2017-04-10 NOTE — ED Notes (Addendum)
Pt informed of need for urine sample but states he does not have to go at this time. Pt was instructed to let clinical sitter or nursing staff know when he is able to provide one.
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
## 2017-04-10 NOTE — ED Notes (Signed)
Called [facility] to [facility] home for patient. The person who answered phone was upset about the return of the patient, did not feel safe. Person who answered phone also stated [facility]. Charge nurse aware. [facility] called.

## 2017-04-10 NOTE — ED Notes (Signed)
Attempted several times to contact [facility].  [phone].

## 2017-04-10 NOTE — Discharge Instructions (Signed)
Take your usual prescriptions as previously directed.  Call your regular medical doctor and your mental health provider tomorrow to schedule a follow up appointment within the next week.  Return to the Emergency Department immediately sooner if worsening.  ° °

## 2017-04-10 NOTE — Progress Notes (Signed)
Per Karleen HampshireSpencer, NP recommend a.m. Psych evaluation Marisa Hage K. Sherlon HandingHarris, LCAS-A, LPC-A, [facility]  Counselor 04/10/2017 6:16 AM

## 2017-04-10 NOTE — ED Provider Notes (Signed)
Pt re-evaluated by psych NP:  No reported SI, no HI, no overt psychosis, there is no criteria to admit at this time, pt can be d/c back to group home with outpt f/u. Will d/c stable.    Samuel JesterMcManus, Rashawn Rolon, DO 04/10/17 1743

## 2017-04-10 NOTE — ED Notes (Signed)
[facility].  Last TTS was at 0530 this am. Spoke with Mick SellLakeisha, pt is to be re-evaluated today.

## 2017-04-22 ENCOUNTER — Ambulatory Visit: Payer: Medicaid Other | Admitting: Podiatry

## 2017-04-25 ENCOUNTER — Emergency Department (HOSPITAL_COMMUNITY)
Admission: EM | Admit: 2017-04-25 | Discharge: 2017-04-26 | Disposition: A | Discharge status: Home / Self Care | Payer: Medicaid Other | Attending: Emergency Medicine | Admitting: Emergency Medicine

## 2017-04-25 ENCOUNTER — Ambulatory Visit (INDEPENDENT_AMBULATORY_CARE_PROVIDER_SITE_OTHER): Payer: Medicaid Other | Admitting: Podiatry

## 2017-04-25 ENCOUNTER — Encounter (HOSPITAL_COMMUNITY): Payer: Self-pay

## 2017-04-25 ENCOUNTER — Encounter: Payer: Self-pay | Admitting: Podiatry

## 2017-04-25 DIAGNOSIS — I10 Essential (primary) hypertension: Secondary | ICD-10-CM | POA: Diagnosis not present

## 2017-04-25 DIAGNOSIS — E1142 Type 2 diabetes mellitus with diabetic polyneuropathy: Secondary | ICD-10-CM

## 2017-04-25 DIAGNOSIS — Z79899 Other long term (current) drug therapy: Secondary | ICD-10-CM | POA: Diagnosis not present

## 2017-04-25 DIAGNOSIS — M79609 Pain in unspecified limb: Principal | ICD-10-CM

## 2017-04-25 DIAGNOSIS — M79676 Pain in unspecified toe(s): Secondary | ICD-10-CM | POA: Diagnosis not present

## 2017-04-25 DIAGNOSIS — R456 Violent behavior: Secondary | ICD-10-CM | POA: Diagnosis present

## 2017-04-25 DIAGNOSIS — F69 Unspecified disorder of adult personality and behavior: Secondary | ICD-10-CM

## 2017-04-25 DIAGNOSIS — F17200 Nicotine dependence, unspecified, uncomplicated: Secondary | ICD-10-CM | POA: Insufficient documentation

## 2017-04-25 DIAGNOSIS — E119 Type 2 diabetes mellitus without complications: Secondary | ICD-10-CM | POA: Insufficient documentation

## 2017-04-25 DIAGNOSIS — Z7984 Long term (current) use of oral hypoglycemic drugs: Secondary | ICD-10-CM | POA: Diagnosis not present

## 2017-04-25 DIAGNOSIS — B351 Tinea unguium: Secondary | ICD-10-CM | POA: Diagnosis not present

## 2017-04-25 DIAGNOSIS — F29 Unspecified psychosis not due to a substance or known physiological condition: Secondary | ICD-10-CM | POA: Insufficient documentation

## 2017-04-25 LAB — CBC
HCT: 36.1 % — ABNORMAL LOW (ref 39.0–52.0)
HEMOGLOBIN: 12.8 g/dL — AB (ref 13.0–17.0)
MCH: 32.7 pg (ref 26.0–34.0)
MCHC: 35.5 g/dL (ref 30.0–36.0)
MCV: 92.3 fL (ref 78.0–100.0)
PLATELETS: 136 10*3/uL — AB (ref 150–400)
RBC: 3.91 MIL/uL — AB (ref 4.22–5.81)
RDW: 13.1 % (ref 11.5–15.5)
WBC: 4.7 10*3/uL (ref 4.0–10.5)

## 2017-04-25 LAB — RAPID URINE DRUG SCREEN, HOSP PERFORMED
Amphetamines: NOT DETECTED
Barbiturates: NOT DETECTED
Benzodiazepines: POSITIVE — AB
COCAINE: NOT DETECTED
OPIATES: NOT DETECTED
TETRAHYDROCANNABINOL: NOT DETECTED

## 2017-04-25 LAB — COMPREHENSIVE METABOLIC PANEL
ALT: 14 U/L — ABNORMAL LOW (ref 17–63)
ANION GAP: 8 (ref 5–15)
AST: 20 U/L (ref 15–41)
Albumin: 3.2 g/dL — ABNORMAL LOW (ref 3.5–5.0)
Alkaline Phosphatase: 33 U/L — ABNORMAL LOW (ref 38–126)
BUN: 9 mg/dL (ref 6–20)
CALCIUM: 9 mg/dL (ref 8.9–10.3)
CHLORIDE: 92 mmol/L — AB (ref 101–111)
CO2: 28 mmol/L (ref 22–32)
Creatinine, Ser: 0.76 mg/dL (ref 0.61–1.24)
Glucose, Bld: 124 mg/dL — ABNORMAL HIGH (ref 65–99)
Potassium: 4 mmol/L (ref 3.5–5.1)
SODIUM: 128 mmol/L — AB (ref 135–145)
Total Bilirubin: 0.5 mg/dL (ref 0.3–1.2)
Total Protein: 6.5 g/dL (ref 6.5–8.1)

## 2017-04-25 LAB — SALICYLATE LEVEL

## 2017-04-25 LAB — ACETAMINOPHEN LEVEL

## 2017-04-25 LAB — ETHANOL

## 2017-04-25 MED ORDER — PROPRANOLOL HCL 10 MG PO TABS
20.0000 mg | ORAL_TABLET | Freq: Three times a day (TID) | ORAL | Status: DC
  Administered 2017-04-26 (×2): 20 mg via ORAL
  Filled 2017-04-25 (×2): qty 2

## 2017-04-25 MED ORDER — HALOPERIDOL 5 MG PO TABS
5.0000 mg | ORAL_TABLET | Freq: Every morning | ORAL | Status: DC
  Administered 2017-04-26: 5 mg via ORAL
  Filled 2017-04-25: qty 1

## 2017-04-25 MED ORDER — DONEPEZIL HCL 5 MG PO TABS
5.0000 mg | ORAL_TABLET | Freq: Every day | ORAL | Status: DC
  Filled 2017-04-25 (×3): qty 1

## 2017-04-25 MED ORDER — NICOTINE 21 MG/24HR TD PT24
21.0000 mg | MEDICATED_PATCH | Freq: Every day | TRANSDERMAL | Status: DC
  Administered 2017-04-26 (×2): 21 mg via TRANSDERMAL
  Filled 2017-04-25 (×2): qty 1

## 2017-04-25 MED ORDER — PRAZOSIN HCL 1 MG PO CAPS
1.0000 mg | ORAL_CAPSULE | Freq: Two times a day (BID) | ORAL | Status: DC
  Administered 2017-04-26: 1 mg via ORAL
  Filled 2017-04-25 (×7): qty 1

## 2017-04-25 MED ORDER — METHOCARBAMOL 500 MG PO TABS
500.0000 mg | ORAL_TABLET | Freq: Three times a day (TID) | ORAL | Status: DC
  Administered 2017-04-26 (×2): 500 mg via ORAL
  Filled 2017-04-25 (×2): qty 1

## 2017-04-25 MED ORDER — LORAZEPAM 1 MG PO TABS
1.0000 mg | ORAL_TABLET | Freq: Four times a day (QID) | ORAL | Status: DC
  Administered 2017-04-26 (×3): 1 mg via ORAL
  Filled 2017-04-25 (×3): qty 1

## 2017-04-25 MED ORDER — METFORMIN HCL 500 MG PO TABS
500.0000 mg | ORAL_TABLET | Freq: Two times a day (BID) | ORAL | Status: DC
  Administered 2017-04-26: 500 mg via ORAL
  Filled 2017-04-25: qty 1

## 2017-04-25 MED ORDER — ONDANSETRON HCL 4 MG PO TABS: 4.0000 mg | ORAL_TABLET | Freq: Three times a day (TID) | ORAL | Status: DC | PRN

## 2017-04-25 MED ORDER — BENZTROPINE MESYLATE 1 MG PO TABS
1.0000 mg | ORAL_TABLET | Freq: Two times a day (BID) | ORAL | Status: DC
  Administered 2017-04-26 (×2): 1 mg via ORAL
  Filled 2017-04-25 (×2): qty 1

## 2017-04-25 MED ORDER — CITALOPRAM HYDROBROMIDE 20 MG PO TABS
40.0000 mg | ORAL_TABLET | Freq: Every morning | ORAL | Status: DC
  Administered 2017-04-26: 40 mg via ORAL
  Filled 2017-04-25 (×4): qty 2

## 2017-04-25 MED ORDER — GABAPENTIN 300 MG PO CAPS
300.0000 mg | ORAL_CAPSULE | Freq: Two times a day (BID) | ORAL | Status: DC
  Administered 2017-04-26 (×2): 300 mg via ORAL
  Filled 2017-04-25 (×2): qty 1

## 2017-04-25 MED ORDER — DIPHENHYDRAMINE HCL 25 MG PO CAPS
25.0000 mg | ORAL_CAPSULE | Freq: Every day | ORAL | Status: DC
  Administered 2017-04-26: 25 mg via ORAL
  Filled 2017-04-25: qty 1

## 2017-04-25 MED ORDER — ACETAMINOPHEN 325 MG PO TABS: 650.0000 mg | ORAL_TABLET | Freq: Four times a day (QID) | ORAL | Status: DC | PRN

## 2017-04-25 MED ORDER — PANTOPRAZOLE SODIUM 40 MG PO TBEC
80.0000 mg | DELAYED_RELEASE_TABLET | Freq: Every day | ORAL | Status: DC
Start: 2017-04-26 — End: 2017-04-26
  Administered 2017-04-26: 80 mg via ORAL
  Filled 2017-04-25: qty 2
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
## 2017-04-25 NOTE — Progress Notes (Signed)
Complaint:  Visit Type: Patient returns to my office for continued preventative foot care services. Complaint: Patient states" my nails have grown long and thick and become painful to walk and wear shoes" Patient has been diagnosed with DM with neuropathy.. The patient presents for preventative foot care services. No changes to ROS  Podiatric Exam: Vascular: dorsalis pedis and posterior tibial pulses are palpable bilateral. Capillary return is immediate. Temperature gradient is WNL. Skin turgor WNL  Sensorium: Diminished  Semmes Weinstein monofilament test. Normal tactile sensation bilaterally. Nail Exam: Pt has thick disfigured discolored nails with subungual debris noted bilateral entire nail hallux through fifth toenails Ulcer Exam: There is no evidence of ulcer or pre-ulcerative changes or infection. Orthopedic Exam: Muscle tone and strength are WNL. No limitations in general ROM. No crepitus or effusions noted. Foot type and digits show no abnormalities. Bony prominences are unremarkable. Skin: No Porokeratosis. No infection or ulcers  Diagnosis:  Onychomycosis, , Pain in right toe, pain in left toes  Treatment & Plan Procedures and Treatment: Consent by patient was obtained for treatment procedures. The patient understood the discussion of treatment and procedures well. All questions were answered thoroughly reviewed. Debridement of mycotic and hypertrophic toenails, 1 through 5 bilateral and clearing of subungual debris. No ulceration, no infection noted. Iatrogenic lesion right hallux treated with silver nitrate. Return Visit-Office Procedure: Patient instructed to return to the office for a follow up visit 3 months for continued evaluation and treatment.    Helane GuntherGregory Crystale Giannattasio DPM
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
## 2017-04-25 NOTE — ED Notes (Signed)
Patient placed in paper scrubs, room secured per protocol for SI patient. Sitter with patient at this time. Patient ambulatory into restroom to attempt to collect urine sample, and patient wanded by security.

## 2017-04-25 NOTE — ED Notes (Signed)
Pt's belongings including a green "girl scouts" bag containing stuffed animals, one bag of clothes (wallet inside of pants pocket), and another bag of books and bibles all secured in locker with patient label stickers on them.

## 2017-04-26 NOTE — ED Notes (Signed)
[facility] to family care home. Nanny, caregiver, would like to speak with pt about acceptable behavior prior to pt returning back. [phone]

## 2017-04-26 NOTE — ED Notes (Signed)
Input: 100% of Breakfast this morning, Patient now sleeping comfortably in Semi-Fowler's position

## 2017-04-26 NOTE — ED Notes (Signed)
Patient sitting in chair comfortably watching TV

## 2017-04-26 NOTE — ED Notes (Signed)
Meal tray given at patient request.

## 2017-04-26 NOTE — ED Notes (Signed)
Pt has ambulated to bathroom. Pt given water to drink per pt's request. Pt repositioned in bed, stretcher locked and in lowest position, warm blanket given.

## 2017-04-26 NOTE — Discharge Instructions (Signed)
Follow up with your md as needee

## 2017-04-26 NOTE — ED Notes (Signed)
Group home called at this time and notified pt is ready for d/c. States it will be approx 1 hour.

## 2017-04-26 NOTE — ED Notes (Signed)
Patient sleeping comfortably.  ?
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
## 2017-04-26 NOTE — ED Notes (Signed)
Patient watching TV and completing sticker activity

## 2017-04-26 NOTE — ED Notes (Signed)
Attempted to call group home at this time, busy signal only no ringing. Will attempt again.

## 2017-04-26 NOTE — BH Assessment (Signed)
Attempted the consult; however, the nurse sts he attempted to awake him and he would not wake up. Nurse felt we would not get much out of the consult and it would be better to attempt tomorrow am.   EW.

## 2017-04-26 NOTE — ED Notes (Signed)
Pt and caregiver given instructions on follow up care. Denies further questions at this time. Pt ambulatory to d/c with caregiver.

## 2017-05-25 ENCOUNTER — Encounter (HOSPITAL_COMMUNITY): Payer: Self-pay | Admitting: *Deleted

## 2017-05-25 ENCOUNTER — Emergency Department (HOSPITAL_COMMUNITY)
Admission: EM | Admit: 2017-05-25 | Discharge: 2017-05-26 | Disposition: A | Discharge status: Home / Self Care | Payer: Medicaid Other | Attending: Emergency Medicine | Admitting: Emergency Medicine

## 2017-05-25 DIAGNOSIS — Z79899 Other long term (current) drug therapy: Secondary | ICD-10-CM | POA: Diagnosis not present

## 2017-05-25 DIAGNOSIS — E119 Type 2 diabetes mellitus without complications: Secondary | ICD-10-CM | POA: Insufficient documentation

## 2017-05-25 DIAGNOSIS — I10 Essential (primary) hypertension: Secondary | ICD-10-CM | POA: Diagnosis not present

## 2017-05-25 DIAGNOSIS — Z046 Encounter for general psychiatric examination, requested by authority: Secondary | ICD-10-CM | POA: Diagnosis present

## 2017-05-25 DIAGNOSIS — F69 Unspecified disorder of adult personality and behavior: Secondary | ICD-10-CM | POA: Diagnosis not present

## 2017-05-25 DIAGNOSIS — F17200 Nicotine dependence, unspecified, uncomplicated: Secondary | ICD-10-CM | POA: Insufficient documentation

## 2017-05-25 DIAGNOSIS — Z7984 Long term (current) use of oral hypoglycemic drugs: Secondary | ICD-10-CM | POA: Diagnosis not present

## 2017-05-25 LAB — CBC WITH DIFFERENTIAL/PLATELET
BASOS ABS: 0 10*3/uL (ref 0.0–0.1)
BASOS PCT: 0 %
EOS ABS: 0 10*3/uL (ref 0.0–0.7)
EOS PCT: 1 %
HCT: 34.1 % — ABNORMAL LOW (ref 39.0–52.0)
Hemoglobin: 12.1 g/dL — ABNORMAL LOW (ref 13.0–17.0)
LYMPHS PCT: 47 %
Lymphs Abs: 1.8 10*3/uL (ref 0.7–4.0)
MCH: 32.6 pg (ref 26.0–34.0)
MCHC: 35.5 g/dL (ref 30.0–36.0)
MCV: 91.9 fL (ref 78.0–100.0)
Monocytes Absolute: 0.6 10*3/uL (ref 0.1–1.0)
Monocytes Relative: 16 %
Neutro Abs: 1.3 10*3/uL — ABNORMAL LOW (ref 1.7–7.7)
Neutrophils Relative %: 35 %
Platelets: 101 10*3/uL — ABNORMAL LOW (ref 150–400)
RBC: 3.71 MIL/uL — AB (ref 4.22–5.81)
RDW: 12.9 % (ref 11.5–15.5)
WBC: 3.7 10*3/uL — AB (ref 4.0–10.5)

## 2017-05-25 LAB — BASIC METABOLIC PANEL
ANION GAP: 8 (ref 5–15)
BUN: 9 mg/dL (ref 6–20)
CALCIUM: 8.7 mg/dL — AB (ref 8.9–10.3)
CO2: 27 mmol/L (ref 22–32)
CREATININE: 0.8 mg/dL (ref 0.61–1.24)
Chloride: 95 mmol/L — ABNORMAL LOW (ref 101–111)
GFR calc non Af Amer: >60 mL/min (ref >60)
Glucose, Bld: 94 mg/dL (ref 65–99)
Potassium: 4.5 mmol/L (ref 3.5–5.1)
SODIUM: 130 mmol/L — AB (ref 135–145)

## 2017-05-25 LAB — ETHANOL

## 2017-05-25 NOTE — ED Provider Notes (Signed)
[facility]-EMERGENCY DEPT Provider Note   CSN: [id number] Arrival date & time: 05/25/17  2042     History   Chief Complaint Chief Complaint  Patient presents with  . V70.1    HPI Jeremy Schultz is a 46 y.o. male.  HPI  Pt was seen at 2055. Per EMS, group home, and pt:  EMS states they were called to [facility] group home for well check by Police after pt walked away from the group home today. Pt states he does not feel safe at the group home, does not like the way he is treated and the behaior from the staff at the group home is causing his depression to increase. Denies SI, no HI, no hallucinations.    Past Medical History:  Diagnosis Date  . Bipolar 1 disorder (HCC) 06/26/2016  . Diabetes mellitus without complication (HCC)   . Hypertension   . Schizophrenia (HCC)   . Seizures [facility])     Patient Active Problem List   Diagnosis Date Noted  . Suicidal ideation   . Diabetes mellitus without complication (HCC) 06/27/2016  . Schizophrenia (HCC) 09/01/2015  . High blood pressure 09/01/2015  . Seizure disorder (HCC) 09/01/2015    Past Surgical History:  Procedure Laterality Date  . APPENDECTOMY         Home Medications    Prior to Admission medications   Medication Sig Start Date End Date Taking? Authorizing Provider  acetaminophen (TYLENOL) 325 MG tablet Take 650 mg by mouth every 6 (six) hours as needed for mild pain or moderate pain.    [provider]  benztropine (COGENTIN) 1 MG tablet Take 1 mg by mouth 2 (two) times daily.    [provider]  citalopram (CELEXA) 40 MG tablet Take 40 mg by mouth every morning.    [provider]  diphenhydrAMINE (BENADRYL) 25 mg capsule Take 25 mg by mouth at bedtime.    [provider]  donepezil (ARICEPT) 5 MG tablet Take 5 mg by mouth at bedtime.    [provider]  gabapentin (NEURONTIN) 300 MG capsule Take 300 mg by mouth 2 (two) times daily.    [provider]    haloperidol (HALDOL) 5 MG tablet Take 5 mg by mouth every morning. *May take one additional tablet daily as needed for Schizophrenia    [provider]  LORazepam (ATIVAN) 1 MG tablet Take 1 mg by mouth 4 (four) times daily.    [provider]  metFORMIN (GLUCOPHAGE) 500 MG tablet Take 500 mg by mouth 2 (two) times daily with a meal.    [provider]  methocarbamol (ROBAXIN) 500 MG tablet Take 500 mg by mouth 3 (three) times daily.    [provider]  neomycin-bacitracin-polymyxin (NEOSPORIN) ointment Apply 1 application topically as needed for wound care. apply to eye    [provider]  omeprazole (PRILOSEC) 40 MG capsule Take 40 mg by mouth daily.    [provider]  prazosin (MINIPRESS) 1 MG capsule Take 1 mg by mouth 2 (two) times daily.    [provider]  propranolol (INDERAL) 20 MG tablet Take 20 mg by mouth 3 (three) times daily.    [provider]  Skin Protectants, Misc. (EUCERIN) cream Apply 1 application topically 2 (two) times daily.    [provider]    Family History No family history on file.  Social History Social History  Substance Use Topics  . Smoking status: Current Every Day Smoker  Packs/day: 1.00  . Smokeless tobacco: Never Used  . Alcohol use No     Allergies   Patient has no known allergies.   Review of Systems Review of Systems   Physical Exam Updated Vital Signs BP 124/71 (BP Location: Left Arm)   Pulse 66   Temp 97.9 F (36.6 C) (Oral)   Resp 18   Wt 90.7 kg (200 lb)   SpO2 96%   BMI 28.70 kg/m   Physical Exam 2100: Physical examination:  Nursing notes reviewed; Vital signs and O2 SAT reviewed;  Constitutional: Well developed, Well nourished, Well hydrated, In no acute distress; Head:  Normocephalic, atraumatic; Eyes: EOMI, PERRL, No scleral icterus; ENMT: Mouth and pharynx normal, Mucous membranes moist; Neck: Supple, Full range of motion, No  lymphadenopathy; Cardiovascular: Regular rate and rhythm, No gallop; Respiratory: Breath sounds clear & equal bilaterally, No wheezes.  Speaking full sentences with ease, Normal respiratory effort/excursion; Chest: Nontender, Movement normal; Abdomen: Soft, Nontender, Nondistended, Normal bowel sounds;; Extremities: Pulses normal, No tenderness, No edema, No calf edema or asymmetry.; Neuro: AA&Ox3, Major CN grossly intact.  Speech clear. No gross focal motor deficits in extremities.; Skin: Color normal, Warm, Dry.; Psych:  Affect flat, poor eye contact.    ED Treatments / Results  Labs (all labs ordered are listed, but only abnormal results are displayed)   EKG  EKG Interpretation None       Radiology   Procedures Procedures (including critical care time)  Medications Ordered in ED Medications - No data to display   Initial Impression / Assessment and Plan / ED Course  I have reviewed the triage vital signs and the nursing notes.  Pertinent labs & imaging results that were available during my care of the patient were reviewed by me and considered in my medical decision making (see chart for details).  MDM Reviewed: previous chart, nursing note and vitals Reviewed previous: labs Interpretation: labs   Results for orders placed or performed during the hospital encounter of 05/25/17  CBC with Differential  Result Value Ref Range   WBC 3.7 (L) 4.0 - 10.5 K/uL   RBC 3.71 (L) 4.22 - 5.81 MIL/uL   Hemoglobin 12.1 (L) 13.0 - 17.0 g/dL   HCT 16.1 (L) 09.6 - 04.5 %   MCV 91.9 78.0 - 100.0 fL   MCH 32.6 26.0 - 34.0 pg   MCHC 35.5 30.0 - 36.0 g/dL   RDW 40.9 81.1 - 91.4 %   Platelets 101 (L) 150 - 400 K/uL   Neutrophils Relative % 35 %   Neutro Abs 1.3 (L) 1.7 - 7.7 K/uL   Lymphocytes Relative 47 %   Lymphs Abs 1.8 0.7 - 4.0 K/uL   Monocytes Relative 16 %   Monocytes Absolute 0.6 0.1 - 1.0 K/uL   Eosinophils Relative 1 %   Eosinophils Absolute 0.0 0.0 - 0.7 K/uL    Basophils Relative 0 %   Basophils Absolute 0.0 0.0 - 0.1 K/uL  Ethanol  Result Value Ref Range   Alcohol, Ethyl (B) <5 <5 mg/dL  Basic metabolic panel  Result Value Ref Range   Sodium 130 (L) 135 - 145 mmol/L   Potassium 4.5 3.5 - 5.1 mmol/L   Chloride 95 (L) 101 - 111 mmol/L   CO2 27 22 - 32 mmol/L   Glucose, Bld 94 65 - 99 mg/dL   BUN 9 6 - 20 mg/dL   Creatinine, Ser 7.82 0.61 - 1.24 mg/dL   Calcium 8.7 (L) 8.9 - 10.3  mg/dL   GFR calc non Af Amer >60 >60 mL/min   GFR calc Af Amer >60 >60 mL/min   Anion gap 8 5 - 15    2345:  Sodium per baseline. Pt sleeping soundly. TTS eval pending.   Final Clinical Impressions(s) / ED Diagnoses   Final diagnoses:  None    New Prescriptions New Prescriptions   No medications on file     Samuel JesterMcManus, Baltazar Pekala, DO 05/25/17 2346

## 2017-05-25 NOTE — ED Notes (Signed)
Pt was not awake enough to do tts consult.

## 2017-05-25 NOTE — ED Notes (Signed)
Pt is asleep at this time.

## 2017-05-25 NOTE — BH Assessment (Signed)
Received TTS consult request. Attempted tele-assessment but Pt was too drowsy to respond to questions. Staff at [facility] will contact TTS at [phone] when Pt is able to participate in assessment.   Harlin RainFord Ellis Patsy BaltimoreWarrick Jr, LPC, [facility], [facility] Triage Specialist [phone]

## 2017-05-25 NOTE — ED Triage Notes (Addendum)
[facility], EMS was called out for well check by law enforcement after pt walked away from the group home today, pt reports that he does not feels safe at the group home and does not like the way that he is treated, denies any thoughts to SI or HI at present time, pt cooperative, pt states that the behavior from staff at group home is causing his depression to increase,

## 2017-05-26 LAB — RAPID URINE DRUG SCREEN, HOSP PERFORMED
Amphetamines: NOT DETECTED
Barbiturates: NOT DETECTED
Benzodiazepines: NOT DETECTED
Cocaine: NOT DETECTED
OPIATES: NOT DETECTED
TETRAHYDROCANNABINOL: NOT DETECTED

## 2017-05-26 LAB — CBG MONITORING, ED: GLUCOSE-CAPILLARY: 171 mg/dL — AB (ref 65–99)

## 2017-05-26 MED ORDER — DIVALPROEX SODIUM 250 MG PO DR TAB
1000.0000 mg | DELAYED_RELEASE_TABLET | Freq: Two times a day (BID) | ORAL | Status: DC
  Administered 2017-05-26: 1000 mg via ORAL
  Filled 2017-05-26: qty 4

## 2017-05-26 MED ORDER — CITALOPRAM HYDROBROMIDE 20 MG PO TABS
40.0000 mg | ORAL_TABLET | Freq: Every morning | ORAL | Status: DC
  Administered 2017-05-26: 40 mg via ORAL
  Filled 2017-05-26 (×2): qty 1

## 2017-05-26 MED ORDER — BENZTROPINE MESYLATE 1 MG PO TABS
1.0000 mg | ORAL_TABLET | Freq: Two times a day (BID) | ORAL | Status: DC
  Administered 2017-05-26: 1 mg via ORAL
  Filled 2017-05-26: qty 1

## 2017-05-26 MED ORDER — FLUTICASONE PROPIONATE 50 MCG/ACT NA SUSP
2.0000 | Freq: Every day | NASAL | Status: DC
  Administered 2017-05-26: 2 via NASAL
  Filled 2017-05-26: qty 16

## 2017-05-26 MED ORDER — PANTOPRAZOLE SODIUM 40 MG PO TBEC
40.0000 mg | DELAYED_RELEASE_TABLET | Freq: Every day | ORAL | Status: DC
  Administered 2017-05-26: 40 mg via ORAL
  Filled 2017-05-26: qty 1

## 2017-05-26 MED ORDER — GABAPENTIN 300 MG PO CAPS
300.0000 mg | ORAL_CAPSULE | Freq: Two times a day (BID) | ORAL | Status: DC
  Administered 2017-05-26: 300 mg via ORAL
  Filled 2017-05-26: qty 1

## 2017-05-26 MED ORDER — PROPRANOLOL HCL 10 MG PO TABS
20.0000 mg | ORAL_TABLET | Freq: Three times a day (TID) | ORAL | Status: DC
  Administered 2017-05-26: 20 mg via ORAL
  Filled 2017-05-26: qty 2

## 2017-05-26 MED ORDER — QUETIAPINE FUMARATE 100 MG PO TABS
50.0000 mg | ORAL_TABLET | Freq: Two times a day (BID) | ORAL | Status: DC
  Administered 2017-05-26: 50 mg via ORAL
  Filled 2017-05-26: qty 1

## 2017-05-26 MED ORDER — PRAZOSIN HCL 1 MG PO CAPS
1.0000 mg | ORAL_CAPSULE | Freq: Two times a day (BID) | ORAL | Status: DC
  Administered 2017-05-26: 1 mg via ORAL
  Filled 2017-05-26 (×3): qty 1

## 2017-05-26 MED ORDER — DONEPEZIL HCL 5 MG PO TABS
5.0000 mg | ORAL_TABLET | Freq: Every day | ORAL | Status: DC
  Filled 2017-05-26: qty 1

## 2017-05-26 MED ORDER — CLONAZEPAM 0.5 MG PO TABS
0.5000 mg | ORAL_TABLET | Freq: Three times a day (TID) | ORAL | Status: DC
  Administered 2017-05-26: 0.5 mg via ORAL
  Filled 2017-05-26: qty 1

## 2017-05-26 MED ORDER — METFORMIN HCL 500 MG PO TABS: 500.0000 mg | ORAL_TABLET | Freq: Two times a day (BID) | ORAL | Status: DC

## 2017-05-26 NOTE — ED Provider Notes (Signed)
No suicidal or homicidal ideation. Patient has been cleared by behavioral health for discharge   Jeremy Schultz, Jeremy Mckellar, MD 05/26/17 1208

## 2017-05-26 NOTE — ED Notes (Signed)
Report given to Elease HashimotoPatricia, supervisor at [facility].   She is checking on transportation for pt back to group home.

## 2017-05-26 NOTE — ED Notes (Signed)
Pt having telepsych

## 2017-05-26 NOTE — ED Notes (Signed)
Dennard NipEugene[facility] and is [facility] to group [facility].  Phone call transferred to Dr. Adriana Simasook.

## 2017-05-26 NOTE — Discharge Instructions (Signed)
Follow-up with community mental health resources °

## 2017-05-26 NOTE — ED Notes (Signed)
Jeremy Hashimotoatricia from group home says they will be here within an hour to pick up pt.

## 2017-05-26 NOTE — ED Notes (Signed)
Pt wanded by security twice, belongings locked in locker in ems bay and cords removed from room.

## 2017-05-26 NOTE — Progress Notes (Addendum)
Nira ConnJason Berry, NP, recommended additional collateral information in order to make a decision regarding patient's disposition.  Writer contacted patient's DSS Case Worker, Derrel Christell ConstantMoore [phone] and reached the mail box. Writer contacted [employer] after hours contact [phone] and was able to leave a voicemail requesting call back.  This Clinical research associatewriter will continue to follow up and try to reach patient's DSS case worker for collateral information.  Melbourne Abtsatia Bayley [place]arborough, LCSWA Disposition staff 05/26/2017 9:04 AM

## 2017-05-26 NOTE — [facility] Counselor (Signed)
Pt was re-assessed this morning.  Pt endorsed conflict at his group home, admitted to eloping due to general unhappiness with group home staff.  Pt has presented with similar complaint.  Pt denied SI, HI, AVH.  Recommended discharge to group home.

## 2017-05-26 NOTE — ED Notes (Signed)
Pt having tts at this time  

## 2017-05-26 NOTE — BH Assessment (Addendum)
Tele Assessment Note   Abbey ChattersJudge D Omary is an 46 y.o. male, who was brought in by Designer, television/film setlaw enforcement officers, after [facility] when Patient walked away from home. Patient reported not liking his group and being involved in an argument that resulted in him leaving on 05/25/2017.  Patient stated experiences suicidal ideations, without a plan.  Patient referred a past attempt to cut himself with a knife, however confirmed no intentions to attempt in that way at the present time.  Patient reported experiences auditory and visual hallucinations from a dragon, without command.  Patient denies HI and access to weapons.  Patient reported experiencing symptoms of fatigue, isolation, tearfulness, and anger. Per medical records, Patient reported not feeling safe in the group home and wanting to leave.  Per medical records, Patient receives outpatient Duffy Rhodyanillya Partridge for psychosis.    Per [facility] Hope, Supervision [facility] [phone]:  Patient often becomes angry if he is denied access to preferred items or activities.  Patient become upset and poured a cup of water on her, prior to leaving the group home.  Emergency Services were contacted.  Patient is able to return back to group home.    During assessment, Patient was cooperative and quiet.  Patient was dressed in scrubs.  Patient was oriented to the place and situation.  Patient kept his head down throughout the entire assessment.  Patient's speech was slow and  inaudible, at times.  Patient's effect appeared to be appropriate to the circumstance. Patient often requested that his DSS Case Worker, Lollie SailsDaryl Moore be contacted.     Diagnosis: Per medical records Patient has a history of Bipolar I Disorder  Per Nira ConnJason Berry, NP additional information required for treatment.  TTS to contact DSS Case Worker, Lollie SailsDaryl Moore [phone]  Past Medical History:  Past Medical History:  Diagnosis Date  .  Bipolar 1 disorder (HCC) 06/26/2016  . Diabetes mellitus without complication (HCC)   . Hypertension   . Schizophrenia (HCC)   . Seizures (HCC)     Past Surgical History:  Procedure Laterality Date  . APPENDECTOMY      Family History: No family history on file.  Social History:  reports that he has been smoking.  He has been smoking about 1.00 pack per day. He has never used smokeless tobacco. He reports that he does not drink alcohol or use drugs.  Additional Social History:  Alcohol / Drug Use Pain Medications: Patient denies Prescriptions: Patient denies Over the Counter: Patient denies History of alcohol / drug use?: No history of alcohol / drug abuse  CIWA: CIWA-Ar BP: 115/75 Pulse Rate: 60 COWS:    PATIENT STRENGTHS: (choose at least two) Communication skills General fund of knowledge Motivation for treatment/growth Supportive family/friends  Allergies: No Known Allergies  Home Medications:  (Not in a hospital admission)  OB/GYN Status:  No LMP for male patient.  General Assessment Data Location of Assessment: [facility] TTS Assessment: In system Is this a Tele or Face-to-Face Assessment?: Tele Assessment Is this an Initial Assessment or a Re-assessment for this encounter?: Initial Assessment Marital status: Single Maiden name: N/A Is patient pregnant?: No Pregnancy Status: No Living Arrangements:  Lawyer([facility] [phone]- [phone]) Can pt return to current living arrangement?: Yes Admission Status: Voluntary Is patient capable of signing voluntary admission?: Yes Referral Source: Self/Family/Friend Insurance type: Medicaid  Medical Screening Exam [facility] Walk[facility]in ONLY) Medical Exam completed: Yes  Crisis Care Plan Living Arrangements:  (  [facility] [phone]) Legal Guardian:  (Emergency contact DSS Worker Lujean Rave [phone]) Name of Psychiatrist: Duffy Rhody NP for family care home Name of Therapist:  none  Education Status Is patient currently in school?: No Current Grade: N/A Highest grade of school patient has completed: unknown Name of school: n/a Contact person: N/A  Risk to self with the past 6 months Suicidal Ideation: Yes-Currently Present Has patient been a risk to self within the past 6 months prior to admission? : No Suicidal Intent: Yes-Currently Present Has patient had any suicidal intent within the past 6 months prior to admission? : No Is patient at risk for suicide?: Yes Suicidal Plan?: No Has patient had any suicidal plan within the past 6 months prior to admission? : No Specify Current Suicidal Plan: Pt. denies a plan.  Patient reports suicidal ideations Access to Means: No Specify Access to Suicidal Means: None What has been your use of drugs/alcohol within the last 12 months?: None Previous Attempts/Gestures: Yes (Pt. reported attempt to cut himself with a knife "years ago.) How many times?: 1 Other Self Harm Risks: None Triggers for Past Attempts: Other (Comment), Unknown (Dislike of placement at group home) Intentional Self Injurious Behavior: None Family Suicide History: Unable to assess Recent stressful life event(s): Conflict (Comment), Other (Comment) (Dislike of group home placement.) Persecutory voices/beliefs?: No Depression: Yes Depression Symptoms: Tearfulness, Fatigue, Feeling worthless/self pity, Feeling angry/irritable Substance abuse history and/or treatment for substance abuse?: No Suicide prevention information given to non-admitted patients: Not applicable  Risk to Others within the past 6 months Homicidal Ideation: No Does patient have any lifetime risk of violence toward others beyond the six months prior to admission? : No Thoughts of Harm to Others: No Current Homicidal Intent: No Current Homicidal Plan: No Access to Homicidal Means: No Identified Victim: None History of harm to others?: No Assessment of Violence: None  Noted Violent Behavior Description: None Does patient have access to weapons?: No Criminal Charges Pending?: No Does patient have a court date: No Is patient on probation?: No  Psychosis Hallucinations: Auditory, Visual (Pt. reports seeing and hearing dragons) Delusions: None noted  Mental Status Report Appearance/Hygiene: In scrubs Eye Contact: Poor Motor Activity: Unremarkable Speech: Slow, Other (Comment) (Inaudible at times) Level of Consciousness: Quiet/awake Mood: Depressed, Despair, Sad Affect: Appropriate to circumstance, Sad, Depressed Anxiety Level: None Thought Processes: Tangential Judgement: Impaired Orientation: Place, Situation Obsessive Compulsive Thoughts/Behaviors: None  Cognitive Functioning Concentration: Poor Memory: Recent Intact, Remote Intact IQ: Average Insight: Fair Impulse Control: Fair Appetite: Fair Weight Loss: 0 Weight Gain: 0 Sleep: No Change Total Hours of Sleep: 8 Vegetative Symptoms: None  ADLScreening [facility] Assessment Services) Patient's cognitive ability adequate to safely complete daily activities?: Yes Patient able to express need for assistance with ADLs?: Yes Independently performs ADLs?: Yes (appropriate for developmental age)  Prior Inpatient Therapy Prior Inpatient Therapy: No Prior Therapy Dates: N/A Prior Therapy Facilty/Provider(s): N/A Reason for Treatment: N/A  Prior Outpatient Therapy Prior Outpatient Therapy: Yes Prior Therapy Dates: Ongoing Prior Therapy Facilty/Provider(s): tanillya patridge NP Reason for Treatment: psychosis Does patient have an ACCT team?: Unknown Does patient have Intensive In-House Services?  : Unknown Does patient have [facility] services? : Unknown Does patient have P4CC services?: Unknown  ADL Screening (condition at time of admission) Patient's cognitive ability adequate to safely complete daily activities?: Yes Is the patient deaf or have difficulty hearing?: No Does the patient  have difficulty seeing, even when wearing glasses/contacts?: No Does the patient  have difficulty concentrating, remembering, or making decisions?: Yes Patient able to express need for assistance with ADLs?: Yes Does the patient have difficulty dressing or bathing?: No Independently performs ADLs?: Yes (appropriate for developmental age) Does the patient have difficulty walking or climbing stairs?: No Weakness of Legs: None Weakness of Arms/Hands: None  Home Assistive Devices/Equipment Home Assistive Devices/Equipment: None    Abuse/Neglect Assessment (Assessment to be complete while patient is alone) Physical Abuse: Denies Verbal Abuse: Yes, present (Comment) (Patient reports verbal [facility] in [facility]-[phone]) Sexual Abuse: Denies Exploitation of patient/patient's resources: Yes, present (Comment), Denies Self-Neglect: Denies Possible abuse reported to:: Other (Comment) (Pt. requested contact to his DSS Case Worker, Lujean Rave, [phone])     Advance Directives (For Healthcare) Does Patient Have a Medical Advance Directive?: No Would patient like information on creating a medical advance directive?: No - Patient declined    Additional Information 1:1 In Past 12 Months?: No CIRT Risk: No Elopement Risk: No Does patient have medical clearance?: Yes     Disposition:  Disposition Initial Assessment Completed for this Encounter: Yes Disposition of Patient: Other dispositions (Per Nira Conn, NP recommendations of additional colleteral) Type of inpatient treatment program:  (Contact DSS Case Worker [phone]) Other disposition(s): Other (Comment) (DSS Case Worker, Lujean Rave [phone])  Talbert Nan 05/26/2017 7:20 AM

## 2017-06-23 ENCOUNTER — Emergency Department (HOSPITAL_COMMUNITY)
Admission: EM | Admit: 2017-06-23 | Discharge: 2017-06-25 | Disposition: A | Discharge status: Home / Self Care | Payer: Medicaid Other | Attending: Emergency Medicine | Admitting: Emergency Medicine

## 2017-06-23 ENCOUNTER — Encounter (HOSPITAL_COMMUNITY): Payer: Self-pay | Admitting: *Deleted

## 2017-06-23 DIAGNOSIS — F69 Unspecified disorder of adult personality and behavior: Secondary | ICD-10-CM | POA: Diagnosis present

## 2017-06-23 DIAGNOSIS — F332 Major depressive disorder, recurrent severe without psychotic features: Secondary | ICD-10-CM | POA: Diagnosis not present

## 2017-06-23 DIAGNOSIS — I10 Essential (primary) hypertension: Secondary | ICD-10-CM | POA: Insufficient documentation

## 2017-06-23 DIAGNOSIS — R45851 Suicidal ideations: Secondary | ICD-10-CM | POA: Diagnosis not present

## 2017-06-23 DIAGNOSIS — E119 Type 2 diabetes mellitus without complications: Secondary | ICD-10-CM | POA: Diagnosis not present

## 2017-06-23 DIAGNOSIS — R443 Hallucinations, unspecified: Secondary | ICD-10-CM | POA: Diagnosis not present

## 2017-06-23 DIAGNOSIS — Z7984 Long term (current) use of oral hypoglycemic drugs: Secondary | ICD-10-CM | POA: Diagnosis not present

## 2017-06-23 DIAGNOSIS — F17210 Nicotine dependence, cigarettes, uncomplicated: Secondary | ICD-10-CM | POA: Diagnosis not present

## 2017-06-23 DIAGNOSIS — F319 Bipolar disorder, unspecified: Secondary | ICD-10-CM | POA: Diagnosis not present

## 2017-06-23 DIAGNOSIS — Z79899 Other long term (current) drug therapy: Secondary | ICD-10-CM | POA: Diagnosis not present

## 2017-06-23 DIAGNOSIS — E118 Type 2 diabetes mellitus with unspecified complications: Secondary | ICD-10-CM | POA: Diagnosis not present

## 2017-06-23 DIAGNOSIS — F209 Schizophrenia, unspecified: Secondary | ICD-10-CM | POA: Diagnosis not present

## 2017-06-23 LAB — CBC WITH DIFFERENTIAL/PLATELET
BASOS ABS: 0 10*3/uL (ref 0.0–0.1)
BASOS PCT: 0 %
EOS PCT: 2 %
Eosinophils Absolute: 0.1 10*3/uL (ref 0.0–0.7)
HCT: 35.8 % — ABNORMAL LOW (ref 39.0–52.0)
Hemoglobin: 12.7 g/dL — ABNORMAL LOW (ref 13.0–17.0)
Lymphocytes Relative: 49 %
Lymphs Abs: 1.9 10*3/uL (ref 0.7–4.0)
MCH: 32.5 pg (ref 26.0–34.0)
MCHC: 35.5 g/dL (ref 30.0–36.0)
MCV: 91.6 fL (ref 78.0–100.0)
MONO ABS: 0.6 10*3/uL (ref 0.1–1.0)
Monocytes Relative: 15 %
Neutro Abs: 1.3 10*3/uL — ABNORMAL LOW (ref 1.7–7.7)
Neutrophils Relative %: 34 %
PLATELETS: 143 10*3/uL — AB (ref 150–400)
RBC: 3.91 MIL/uL — AB (ref 4.22–5.81)
RDW: 13.2 % (ref 11.5–15.5)
WBC: 3.8 10*3/uL — AB (ref 4.0–10.5)

## 2017-06-23 LAB — COMPREHENSIVE METABOLIC PANEL
ALBUMIN: 3.4 g/dL — AB (ref 3.5–5.0)
ALK PHOS: 31 U/L — AB (ref 38–126)
ALT: 17 U/L (ref 17–63)
ANION GAP: 7 (ref 5–15)
AST: 21 U/L (ref 15–41)
BUN: 11 mg/dL (ref 6–20)
CALCIUM: 9.5 mg/dL (ref 8.9–10.3)
CHLORIDE: 97 mmol/L — AB (ref 101–111)
CO2: 30 mmol/L (ref 22–32)
Creatinine, Ser: 1.02 mg/dL (ref 0.61–1.24)
GFR calc Af Amer: >60 mL/min (ref >60)
GFR calc non Af Amer: >60 mL/min (ref >60)
GLUCOSE: 124 mg/dL — AB (ref 65–99)
Potassium: 4.2 mmol/L (ref 3.5–5.1)
SODIUM: 134 mmol/L — AB (ref 135–145)
Total Bilirubin: 0.5 mg/dL (ref 0.3–1.2)
Total Protein: 6.7 g/dL (ref 6.5–8.1)

## 2017-06-23 LAB — RAPID URINE DRUG SCREEN, HOSP PERFORMED
AMPHETAMINES: NOT DETECTED
BARBITURATES: NOT DETECTED
BENZODIAZEPINES: NOT DETECTED
Cocaine: NOT DETECTED
Opiates: NOT DETECTED
TETRAHYDROCANNABINOL: NOT DETECTED

## 2017-06-23 MED ORDER — BENZTROPINE MESYLATE 1 MG PO TABS
1.0000 mg | ORAL_TABLET | Freq: Two times a day (BID) | ORAL | Status: DC
  Administered 2017-06-24 – 2017-06-25 (×3): 1 mg via ORAL
  Filled 2017-06-23 (×3): qty 1

## 2017-06-23 MED ORDER — PANTOPRAZOLE SODIUM 40 MG PO TBEC
40.0000 mg | DELAYED_RELEASE_TABLET | Freq: Every day | ORAL | Status: DC
  Administered 2017-06-24 – 2017-06-25 (×2): 40 mg via ORAL
  Filled 2017-06-23 (×2): qty 1

## 2017-06-23 MED ORDER — PROPRANOLOL HCL 10 MG PO TABS
20.0000 mg | ORAL_TABLET | Freq: Three times a day (TID) | ORAL | Status: DC
  Administered 2017-06-24 – 2017-06-25 (×5): 20 mg via ORAL
  Filled 2017-06-23 (×5): qty 2

## 2017-06-23 MED ORDER — CITALOPRAM HYDROBROMIDE 20 MG PO TABS
40.0000 mg | ORAL_TABLET | Freq: Every morning | ORAL | Status: DC
  Administered 2017-06-24 – 2017-06-25 (×2): 40 mg via ORAL
  Filled 2017-06-23 (×3): qty 2

## 2017-06-23 MED ORDER — QUETIAPINE FUMARATE 100 MG PO TABS
50.0000 mg | ORAL_TABLET | Freq: Two times a day (BID) | ORAL | Status: DC
  Administered 2017-06-24 – 2017-06-25 (×3): 50 mg via ORAL
  Filled 2017-06-23 (×3): qty 1

## 2017-06-23 MED ORDER — DONEPEZIL HCL 5 MG PO TABS
5.0000 mg | ORAL_TABLET | Freq: Every day | ORAL | Status: DC
  Administered 2017-06-24: 5 mg via ORAL
  Filled 2017-06-23 (×2): qty 1

## 2017-06-23 MED ORDER — CLONAZEPAM 0.5 MG PO TABS
0.5000 mg | ORAL_TABLET | Freq: Three times a day (TID) | ORAL | Status: DC
  Administered 2017-06-24 – 2017-06-25 (×5): 0.5 mg via ORAL
  Filled 2017-06-23 (×5): qty 1

## 2017-06-23 MED ORDER — GABAPENTIN 300 MG PO CAPS
300.0000 mg | ORAL_CAPSULE | Freq: Two times a day (BID) | ORAL | Status: DC
  Administered 2017-06-24 – 2017-06-25 (×3): 300 mg via ORAL
  Filled 2017-06-23 (×3): qty 1

## 2017-06-23 MED ORDER — PRAZOSIN HCL 1 MG PO CAPS
1.0000 mg | ORAL_CAPSULE | Freq: Two times a day (BID) | ORAL | Status: DC
  Administered 2017-06-24 – 2017-06-25 (×3): 1 mg via ORAL
  Filled 2017-06-23 (×5): qty 1

## 2017-06-23 MED ORDER — METFORMIN HCL 500 MG PO TABS
500.0000 mg | ORAL_TABLET | Freq: Two times a day (BID) | ORAL | Status: DC
  Administered 2017-06-24 – 2017-06-25 (×4): 500 mg via ORAL
  Filled 2017-06-23 (×4): qty 1

## 2017-06-23 NOTE — ED Notes (Signed)
Pt has eaten 100 per cent of supper

## 2017-06-23 NOTE — ED Triage Notes (Signed)
Pt comes in by EMS from his group home, [facility]. Pt got mad at facility today and walked out. Police were called, they brought him back to the facility. He expressed feelings of coming to the hospital because he wanted to kill people at the facility he is living at because he is not happy there.
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
## 2017-06-23 NOTE — ED Notes (Signed)
Dr Z in to assess 

## 2017-06-23 NOTE — ED Notes (Signed)
Lab to draw

## 2017-06-23 NOTE — ED Notes (Signed)
TTS complete with re eval in am

## 2017-06-23 NOTE — ED Notes (Signed)
Pt desires to order a specific breakfast for tomorrow

## 2017-06-23 NOTE — ED Provider Notes (Signed)
[facility] Provider Note   CSN: [id number] Arrival date & time: 06/23/17  1701     History   Chief Complaint Chief Complaint  Patient presents with  . V70.1    HPI Jeremy Schultz is a 46 y.o. male.  Patient with history of schizophrenia, diabetes, bipolar, lives in group home presents with suicidal homicidal ideation. The past few days patient has been frustrated because he is not able to smoke cigarettes when he wants on his time. Patient's been frustrated with Miss Ross. Patient's had thoughts of killing himself with a kitchen knife and hurting Miss Ross. Patient is hearing voices telling the same. Patient's had multiple assessments in the past. Patient denies other symptoms      Past Medical History:  Diagnosis Date  . Bipolar 1 disorder (HCC) 06/26/2016  . Diabetes mellitus without complication (HCC)   . Hypertension   . Schizophrenia (HCC)   . Seizures [facility])     Patient Active Problem List   Diagnosis Date Noted  . Suicidal ideation   . Diabetes mellitus without complication (HCC) 06/27/2016  . Schizophrenia (HCC) 09/01/2015  . High blood pressure 09/01/2015  . Seizure disorder (HCC) 09/01/2015    Past Surgical History:  Procedure Laterality Date  . APPENDECTOMY         Home Medications    Prior to Admission medications   Medication Sig Start Date End Date Taking? Authorizing Provider  acetaminophen (TYLENOL) 325 MG tablet Take 650 mg by mouth every 6 (six) hours as needed for mild pain or moderate pain.   Yes [provider]  benztropine (COGENTIN) 1 MG tablet Take 1 mg by mouth 2 (two) times daily.   Yes [provider]  cetirizine (ZYRTEC) 10 MG tablet Take 10 mg by mouth daily.   Yes [provider]  citalopram (CELEXA) 40 MG tablet Take 40 mg by mouth every morning.   Yes [provider]  clonazePAM (KLONOPIN) 0.5 MG tablet Take 0.5 mg by mouth 3 (three) times daily. *May take 1mg  by mouth once daily as  needed for anxiety   Yes [provider]  divalproex (DEPAKOTE) 500 MG DR tablet Take 50-1,000 mg by mouth 2 (two) times daily. 500MG  IN THE MORNING AND 1000MG  AT BEDTIME   Yes [provider]  donepezil (ARICEPT) 5 MG tablet Take 5 mg by mouth at bedtime.   Yes [provider]  fluticasone (FLONASE) 50 MCG/ACT nasal spray Place 2 sprays into both nostrils daily.   Yes [provider]  gabapentin (NEURONTIN) 300 MG capsule Take 300 mg by mouth 2 (two) times daily.   Yes [provider]  lactulose (CHRONULAC) 10 GM/15ML solution Take 10 g by mouth daily.   Yes [provider]  metFORMIN (GLUCOPHAGE) 500 MG tablet Take 500 mg by mouth 2 (two) times daily with a meal.   Yes [provider]  methocarbamol (ROBAXIN) 500 MG tablet Take 500 mg by mouth 3 (three) times daily.   Yes [provider]  omeprazole (PRILOSEC) 20 MG capsule Take 40 mg by mouth 2 (two) times daily.    Yes [provider]  paliperidone (INVEGA SUSTENNA) 234 MG/1.5ML SUSP injection Inject 234 mg into the muscle every 30 (thirty) days.   Yes [provider]  prazosin (MINIPRESS) 1 MG capsule Take 1 mg by mouth 2 (two) times daily.   Yes [provider]  propranolol (INDERAL) 20 MG tablet Take 20 mg by mouth 3 (three) times daily.  Yes [provider]  QUEtiapine (SEROQUEL) 50 MG tablet Take 50 mg by mouth 2 (two) times daily.   Yes [provider]  Skin Protectants, Misc. (EUCERIN) cream Apply 1 application topically 2 (two) times daily.   Yes [provider]  sodium chloride (OCEAN) 0.65 % SOLN nasal spray Place 1 spray into both nostrils 3 (three) times daily as needed for congestion.   Yes [provider]    Family History No family history on file.  Social History Social History  Substance Use Topics  . Smoking status: Current Every Day Smoker    Packs/day: 1.00  . Smokeless tobacco: Never  Used  . Alcohol use No     Allergies   Patient has no known allergies.   Review of Systems Review of Systems  Constitutional: Negative for chills and fever.  HENT: Negative for congestion.   Eyes: Negative for visual disturbance.  Respiratory: Negative for shortness of breath.   Cardiovascular: Negative for chest pain.  Gastrointestinal: Negative for abdominal pain and vomiting.  Genitourinary: Negative for dysuria and flank pain.  Musculoskeletal: Negative for back pain, neck pain and neck stiffness.  Skin: Negative for rash.  Neurological: Negative for light-headedness and headaches.  Psychiatric/Behavioral: Positive for self-injury and suicidal ideas.     Physical Exam Updated Vital Signs BP 90/72 (BP Location: Left Arm)   Pulse 79   Temp 97.8 F (36.6 C) (Oral)   Resp 16   Ht 5\' 10"  (1.778 m)   Wt 90.7 kg (200 lb)   SpO2 95%   BMI 28.70 kg/m   Physical Exam  Constitutional: He is oriented to person, place, and time. He appears well-developed and well-nourished.  HENT:  Head: Normocephalic and atraumatic.  Eyes: Conjunctivae are normal. Right eye exhibits no discharge. Left eye exhibits no discharge.  Neck: Normal range of motion. Neck supple. No tracheal deviation present.  Cardiovascular: Normal rate and regular rhythm.   Pulmonary/Chest: Effort normal and breath sounds normal.  Abdominal: Soft. He exhibits no distension. There is no tenderness. There is no guarding.  Musculoskeletal: He exhibits no edema.  Neurological: He is alert and oriented to person, place, and time.  Skin: Skin is warm. No rash noted.  Psychiatric: His speech is not rapid and/or pressured. He is slowed. He expresses homicidal and suicidal ideation. He expresses suicidal plans.  Nursing note and vitals reviewed.    ED Treatments / Results  Labs (all labs ordered are listed, but only abnormal results are displayed) Labs Reviewed  COMPREHENSIVE METABOLIC PANEL - Abnormal; Notable  for the following:       Result Value   Sodium 134 (*)    Chloride 97 (*)    Glucose, Bld 124 (*)    Albumin 3.4 (*)    Alkaline Phosphatase 31 (*)    All other components within normal limits  CBC WITH DIFFERENTIAL/PLATELET - Abnormal; Notable for the following:    WBC 3.8 (*)    RBC 3.91 (*)    Hemoglobin 12.7 (*)    HCT 35.8 (*)    Platelets 143 (*)    Neutro Abs 1.3 (*)    All other components within normal limits  RAPID URINE DRUG SCREEN, HOSP PERFORMED    EKG  EKG Interpretation None       Radiology No results found.  Procedures Procedures (including critical care time)  Medications Ordered in ED Medications - No data to display   Initial Impression / Assessment and Plan / ED  Course  I have reviewed the triage vital signs and the nursing notes.  Pertinent labs & imaging results that were available during my care of the patient were reviewed by me and considered in my medical decision making (see chart for details).    Patient presents frustrated from the group home. Patient states his primary goal is to change group homes. Patient does have thoughts of self-harm and hurting when the workers at the group home. Plan for observation the ER behavior health assessment. Patient medically clear my exam.  Patient cooperative in the ER. IVC paperwork completed. Final disposition pending.   Final Clinical Impressions(s) / ED Diagnoses   Final diagnoses:  Suicidal ideation    New Prescriptions New Prescriptions   No medications on file     Blane Ohara, MD 06/23/17 2259

## 2017-06-23 NOTE — ED Notes (Addendum)
Purvis SheffieldDARRYL MOORE DSS GUARDIAN [phone]  (DSS [place] ?)
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
## 2017-06-23 NOTE — ED Notes (Signed)
Per pt: He is unhappy at home  Has been warning "Ms Tenny CrawRoss" and "she won't take my advice"  Is homicidal towards Ms Tenny CrawRoss- " "I'm suicidal, I want to hurt somebody" - When asked whom, he replies, "Ms Tenny CrawRoss"  "I want to beat her- no I don't want to beat her, I want to destroy her!"  Previous admissions? "Dozens, dozens"

## 2017-06-24 DIAGNOSIS — E118 Type 2 diabetes mellitus with unspecified complications: Secondary | ICD-10-CM | POA: Diagnosis not present

## 2017-06-24 DIAGNOSIS — F332 Major depressive disorder, recurrent severe without psychotic features: Secondary | ICD-10-CM | POA: Diagnosis not present

## 2017-06-24 DIAGNOSIS — F17210 Nicotine dependence, cigarettes, uncomplicated: Secondary | ICD-10-CM

## 2017-06-24 DIAGNOSIS — R45851 Suicidal ideations: Secondary | ICD-10-CM

## 2017-06-24 DIAGNOSIS — R45850 Homicidal ideations: Secondary | ICD-10-CM | POA: Diagnosis not present

## 2017-06-24 DIAGNOSIS — R443 Hallucinations, unspecified: Secondary | ICD-10-CM | POA: Diagnosis not present

## 2017-06-24 NOTE — ED Notes (Signed)
Pt awake and hungry, Gave pt a snack

## 2017-06-24 NOTE — ED Notes (Signed)
Pt ate % plus extra cereal for breakfast.
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
## 2017-06-24 NOTE — Progress Notes (Signed)
CSW reviewed pt chart. Per Elta GuadeloupeLaurie Parks, NP, pt meets criteria for inpatient hospitalization.  [facility],  [facility],  [facility],  [facility],  [facility],  [facility]   Disposition:  CSW will continue to follow for placement.  Timmothy EulerJean T. Kaylyn LimSutter, MSW, LCSWA Disposition Clinical Social Work [phone] (cell) [phone] (office)

## 2017-06-24 NOTE — ED Notes (Signed)
TTS machine in room with patient.   

## 2017-06-24 NOTE — ED Notes (Signed)
Pt given coloring pages and stuffed animal for comfort measures

## 2017-06-25 LAB — CBG MONITORING, ED: Glucose-Capillary: 110 mg/dL — ABNORMAL HIGH (ref 65–99)

## 2017-06-25 NOTE — Consult Note (Signed)
Telepsych Consultation   Reason for Consult: HI Referring Physician: EDP Patient Identification: HASHIM EICHHORST MRN:  [id number] Principal Diagnosis: <principal problem not specified> Diagnosis:   Patient Active Problem List   Diagnosis Date Noted  . Suicidal ideation [R45.851]   . Diabetes mellitus without complication ([place]) [E33.2] 06/27/2016  . Schizophrenia ([place]) [F20.9] 09/01/2015  . High blood pressure [I10] 09/01/2015  . Seizure disorder ([place]) [R51.884] 09/01/2015    Total Time spent with patient: 30 minutes  Subjective:   [name] is a 46 y.o. male patient admitted with Major Depressive Disorder.  HPI: Per the assessment completed 06/23/17 by Alycia Patten: ALLISON SILVA is an 46 y.o. male white male IVC to [facility] due to HI.  Ovide Dusek denies being homicidal at this time. Jaedon Siler denies current SI; however, sts he had two past suicidal gestures.  SI endorses AVH.  He states he hears voices laughing at him, calling him names and sometimes tells him to harm others.  He denies current AH.  He states he sometime sees a red dragon flying around his room.  However, he denies current VH.  Everett Ehrler expressed he is fed up with his current group home and states the staff mistreated him two days ago.  He states he no longer want to return to his current facility. He sts he is under the care of a Nurse Practitioner medication. Junah Yam expresses being sad, depressed, tearful, guilty, isolated from other and having self pity.  Myriam Forehand sts he lives in a group home and cannot return.  He sts he is not happy there.  He denies having family support and sts his family has died and he has no one which causes him to feel sad.  He sts he does not abuse illicit drugs or alcohol.  He denies having any legal charges pending at this this time.  He wishes to speak with a Education officer, museum to gather information about the side effects of his medication and to assist with finding  him another facility.  Terrel Nesheiwat presented in hospital scrubs with an unremarkable appearance.  He had poor eye contact and coherent speech. He was quiet and had a low voice tone.  He had freedom of movement all though he was hyperactive.  His mood was sad and depressed.  His affect was congruent with his mood. His thought content was coherent.  His judgement was impaired and his impulses fair.    On Exam: Patient was seen, chart reviewed by treatment team. Patient was lying down in bed 45 degree angle, awake, alert and oriented x4. Patient reiterated the reason for this hospital admissio as documented above. Patient stated, I don't want to go back to that group home again. Ms. Harrington Challenger is treating me like a dumb ass". Patient stated that Ms. Harrington Challenger is not letting him have want he wants that she restrains him sometimes and can't let him smoke as needed. Patient stated that he has lived in the same group home for about 3 years. He said he wants [place] to get intous with Jules Husbands for him because he needs some money. Patient currently denies and SI, said that that is the last thing on my mind. Patient endorsing HI towards Ms. Ross. Patient also endorsing visual hallucination of seeing red dragon as well as auditiory halluciantion of hearing the devil. Patient stated that "Lonna Cobb" is looking for another group home for him that hopefully will be ready in 2 days.  Follow Up Consult 06/25/17 Patient reevaluated today and case discussed with treatment team. Patient was lying down in bed, awake and oriented x4. Patient stated he didn't sleep good last night because of a bad dream he had. Patient stating that he is now feeling suicidal due to having a bad dream. Patient was reminded what his statement was yesterday, "suicide is the last thing on my mind". Patient continues to endorse HI and stating that he does not want to return to that group home. Patient continues to request that we contact Carlena Bjornstad on his  behalf and have him give patient a call. Patient stating that he wants Elsie Stain to bring him some money. Patient started crying towards the end of this encounter.  Past Psychiatric History: See H& P  Risk to Self: Suicidal Ideation: No-Not Currently/Within Last 6 Months Suicidal Intent: No-Not Currently/Within Last 6 Months Is patient at risk for suicide?: No Suicidal Plan?: No-Not Currently/Within Last 6 Months Specify Current Suicidal Plan: N/a Access to Means: No What has been your use of drugs/alcohol within the last 12 months?: No How many times?: 2 Other Self Harm Risks: Pt sts he burns himself with a lighter Triggers for Past Attempts: Other personal contacts Intentional Self Injurious Behavior: Burning Comment - Self Injurious Behavior: Pt sts he burned himself with a lighter Risk to Others: Homicidal Ideation: No-Not Currently/Within Last 6 Months Thoughts of Harm to Others: No-Not Currently Present/Within Last 6 Months Current Homicidal Intent: No-Not Currently/Within Last 6 Months Current Homicidal Plan: No-Not Currently/Within Last 6 Months Access to Homicidal Means: No Identified Victim: NA History of harm to others?: No Assessment of Violence: None Noted Violent Behavior Description: NA Does patient have access to weapons?: No Criminal Charges Pending?: No Does patient have a court date: No Prior Inpatient Therapy: Prior Inpatient Therapy: No Prior Therapy Dates: N/A Prior Therapy Facilty/Provider(s): N/A Reason for Treatment: N/A Prior Outpatient Therapy: Prior Outpatient Therapy: Yes Prior Therapy Dates: Ongoing Prior Therapy Facilty/Provider(s): tanillya patridge NP Reason for Treatment: psychosis Does patient have an ACCT team?: No Does patient have Intensive In-House Services?  : No Does patient have [facility] services? : No Does patient have P4CC services?: No  Past Medical History:  Past Medical History:  Diagnosis Date  . Bipolar 1 disorder ([place])  06/26/2016  . Diabetes mellitus without complication ([place])   . Hypertension   . Schizophrenia ([place])   . Seizures ([place])     Past Surgical History:  Procedure Laterality Date  . APPENDECTOMY     Family History: No family history on file. Family Psychiatric  History:   Social History:  History  Alcohol Use No     History  Drug Use No    Social History   Social History  . Marital status: Single    Spouse name: N/A  . Number of children: N/A  . Years of education: N/A   Social History Main Topics  . Smoking status: Current Every Day Smoker    Packs/day: 1.00  . Smokeless tobacco: Never Used  . Alcohol use No  . Drug use: No  . Sexual activity: Not Asked   Other Topics Concern  . None   Social History Narrative  . None   Additional Social History:   Allergies:  No Known Allergies  Labs:  Results for orders placed or performed during the hospital encounter of 06/23/17 (from the past 48 hour(s))  Urine rapid drug screen (hosp performed)     Status: None  Collection Time: 06/23/17  5:20 PM  Result Value Ref Range   Opiates NONE DETECTED NONE DETECTED   Cocaine NONE DETECTED NONE DETECTED   Benzodiazepines NONE DETECTED NONE DETECTED   Amphetamines NONE DETECTED NONE DETECTED   Tetrahydrocannabinol NONE DETECTED NONE DETECTED   Barbiturates NONE DETECTED NONE DETECTED    Comment:        DRUG SCREEN FOR MEDICAL PURPOSES ONLY.  IF CONFIRMATION IS NEEDED FOR ANY PURPOSE, NOTIFY LAB WITHIN 5 DAYS.        LOWEST DETECTABLE LIMITS FOR URINE DRUG SCREEN Drug Class       Cutoff (ng/mL) Amphetamine      1000 Barbiturate      200 Benzodiazepine   818 Tricyclics       563 Opiates          300 Cocaine          300 THC              50   Comprehensive metabolic panel     Status: Abnormal   Collection Time: 06/23/17  5:28 PM  Result Value Ref Range   Sodium 134 (L) 135 - 145 mmol/L   Potassium 4.2 3.5 - 5.1 mmol/L   Chloride 97 (L) 101 - 111 mmol/L   CO2 30 22  - 32 mmol/L   Glucose, Bld 124 (H) 65 - 99 mg/dL   BUN 11 6 - 20 mg/dL   Creatinine, Ser 1.02 0.61 - 1.24 mg/dL   Calcium 9.5 8.9 - 10.3 mg/dL   Total Protein 6.7 6.5 - 8.1 g/dL   Albumin 3.4 (L) 3.5 - 5.0 g/dL   AST 21 15 - 41 U/L   ALT 17 17 - 63 U/L   Alkaline Phosphatase 31 (L) 38 - 126 U/L   Total Bilirubin 0.5 0.3 - 1.2 mg/dL   GFR calc non Af Amer >60 >60 mL/min   GFR calc Af Amer >60 >60 mL/min    Comment: (NOTE) The eGFR has been calculated using the CKD EPI equation. This calculation has not been validated in all clinical situations. eGFR's persistently <60 mL/min signify possible Chronic Kidney Disease.    Anion gap 7 5 - 15  CBC with Diff     Status: Abnormal   Collection Time: 06/23/17  5:28 PM  Result Value Ref Range   WBC 3.8 (L) 4.0 - 10.5 K/uL   RBC 3.91 (L) 4.22 - 5.81 MIL/uL   Hemoglobin 12.7 (L) 13.0 - 17.0 g/dL   HCT 35.8 (L) 39.0 - 52.0 %   MCV 91.6 78.0 - 100.0 fL   MCH 32.5 26.0 - 34.0 pg   MCHC 35.5 30.0 - 36.0 g/dL   RDW 13.2 11.5 - 15.5 %   Platelets 143 (L) 150 - 400 K/uL   Neutrophils Relative % 34 %   Neutro Abs 1.3 (L) 1.7 - 7.7 K/uL   Lymphocytes Relative 49 %   Lymphs Abs 1.9 0.7 - 4.0 K/uL   Monocytes Relative 15 %   Monocytes Absolute 0.6 0.1 - 1.0 K/uL   Eosinophils Relative 2 %   Eosinophils Absolute 0.1 0.0 - 0.7 K/uL   Basophils Relative 0 %   Basophils Absolute 0.0 0.0 - 0.1 K/uL  CBG monitoring, ED     Status: Abnormal   Collection Time: 06/25/17  7:33 AM  Result Value Ref Range   Glucose-Capillary 110 (H) 65 - 99 mg/dL    Current Facility-Administered Medications  Medication Dose Route Frequency Provider  Last Rate Last Dose  . benztropine (COGENTIN) tablet 1 mg  1 mg Oral BID Elnora Morrison, MD   1 mg at 06/24/17 2243  . citalopram (CELEXA) tablet 40 mg  40 mg Oral q morning - 10a Elnora Morrison, MD   40 mg at 06/24/17 0920  . clonazePAM (KLONOPIN) tablet 0.5 mg  0.5 mg Oral TID Elnora Morrison, MD   0.5 mg at 06/24/17 2208   . donepezil (ARICEPT) tablet 5 mg  5 mg Oral QHS Elnora Morrison, MD   5 mg at 06/24/17 2208  . gabapentin (NEURONTIN) capsule 300 mg  300 mg Oral BID Elnora Morrison, MD   300 mg at 06/24/17 2208  . metFORMIN (GLUCOPHAGE) tablet 500 mg  500 mg Oral BID WC Elnora Morrison, MD   500 mg at 06/25/17 0733  . pantoprazole (PROTONIX) EC tablet 40 mg  40 mg Oral Daily Elnora Morrison, MD   40 mg at 06/24/17 0920  . prazosin (MINIPRESS) capsule 1 mg  1 mg Oral BID Elnora Morrison, MD   1 mg at 06/24/17 2208  . propranolol (INDERAL) tablet 20 mg  20 mg Oral TID Elnora Morrison, MD   20 mg at 06/24/17 2208  . QUEtiapine (SEROQUEL) tablet 50 mg  50 mg Oral BID Elnora Morrison, MD   50 mg at 06/24/17 2208   Current Outpatient Prescriptions  Medication Sig Dispense Refill  . acetaminophen (TYLENOL) 325 MG tablet Take 650 mg by mouth every 6 (six) hours as needed for mild pain or moderate pain.    . benztropine (COGENTIN) 1 MG tablet Take 1 mg by mouth 2 (two) times daily.    . cetirizine (ZYRTEC) 10 MG tablet Take 10 mg by mouth daily.    . citalopram (CELEXA) 40 MG tablet Take 40 mg by mouth every morning.    . clonazePAM (KLONOPIN) 0.5 MG tablet Take 0.5 mg by mouth 3 (three) times daily. *May take 110m by mouth once daily as needed for anxiety    . divalproex (DEPAKOTE) 500 MG DR tablet Take 50-1,000 mg by mouth 2 (two) times daily. 500MG IN THE MORNING AND 1000MG AT BEDTIME    . donepezil (ARICEPT) 5 MG tablet Take 5 mg by mouth at bedtime.    . fluticasone (FLONASE) 50 MCG/ACT nasal spray Place 2 sprays into both nostrils daily.    .Marland Kitchengabapentin (NEURONTIN) 300 MG capsule Take 300 mg by mouth 2 (two) times daily.    .Marland Kitchenlactulose (CHRONULAC) 10 GM/15ML solution Take 10 g by mouth daily.    . metFORMIN (GLUCOPHAGE) 500 MG tablet Take 500 mg by mouth 2 (two) times daily with a meal.    . methocarbamol (ROBAXIN) 500 MG tablet Take 500 mg by mouth 3 (three) times daily.    .Marland Kitchenomeprazole (PRILOSEC) 20 MG capsule Take  40 mg by mouth 2 (two) times daily.     . paliperidone (INVEGA SUSTENNA) 234 MG/1.5ML SUSP injection Inject 234 mg into the muscle every 30 (thirty) days.    . prazosin (MINIPRESS) 1 MG capsule Take 1 mg by mouth 2 (two) times daily.    . propranolol (INDERAL) 20 MG tablet Take 20 mg by mouth 3 (three) times daily.    . QUEtiapine (SEROQUEL) 50 MG tablet Take 50 mg by mouth 2 (two) times daily.    . Skin Protectants, Misc. (EUCERIN) cream Apply 1 application topically 2 (two) times daily.    . sodium chloride (OCEAN) 0.65 % SOLN nasal spray Place 1  spray into both nostrils 3 (three) times daily as needed for congestion.      Musculoskeletal: UTA via camera  Psychiatric Specialty Exam: Physical Exam  Nursing note and vitals reviewed.   Review of Systems  Psychiatric/Behavioral: Positive for hallucinations. Negative for depression, memory loss, substance abuse and suicidal ideas. The patient is not nervous/anxious and does not have insomnia.   All other systems reviewed and are negative.   Blood pressure 113/66, pulse 61, temperature 98 F (36.7 C), temperature source Oral, resp. rate 18, height _0  (1.778 m), weight 90.7 kg (200 lb), SpO2 94 %.Body mass index is 28.7 kg/m.  General Appearance: on hospital scrub  Eye Contact:  Fair  Speech:  Normal Rate and difficult to understand  Volume:  Normal  Mood:  Anxious and Depressed  Affect:  Congruent  Thought Process:  Coherent and Goal Directed  Orientation:  Full (Time, Place, and Person)  Thought Content:  WDL and Logical  Suicidal Thoughts:  No  Homicidal Thoughts:  Yes.  with intent/plan  Memory:  Immediate;   Good Recent;   Fair Remote;   Fair  Judgement:  Intact  Insight:  Fair  Psychomotor Activity:  Normal  Concentration:  Concentration: Good and Attention Span: Good  Recall:  Good  Fund of Knowledge:  Good  Language:  Negative  Akathisia:  Negative  Handed:  Right  AIMS (if indicated):     Assets:  Desire for  Improvement Housing Leisure Time Social Support  ADL's:  Intact  Cognition:  WNL  Sleep:        Treatment Plan Summary: Plan to admit to inpatient hospitalzation for med management and stabilization  Patient currently having HI towards a known individual whom he has access to as well as voicing both [facility] Patient is now voicing SI with no particular plan.  Disposition: Recommend psychiatric Inpatient admission when medically cleared.  Vicenta Aly, NP 06/25/2017 9:29 AM

## 2017-06-25 NOTE — ED Notes (Signed)
Spoke with Asa LenteNanny, Quarry managerHouse Manager [facility] to notify her of pt being discharged. Asa Lenteanny is arranging transportation back to facility.

## 2017-06-25 NOTE — ED Notes (Signed)
Pt calm. Cooperative. Denies si/hi/avh. Eating breakfast. Nad.

## 2017-06-25 NOTE — ED Notes (Signed)
[facility] called and pt is ok for discharge back to facility. No need for inpt behavioral stay. [facility] EDP notified.

## 2017-06-25 NOTE — Progress Notes (Addendum)
Patient's chart reviewed in details following SW conversations with the group home house manager, Jeremy Schultz, who has given collateral that is safe for patient to return back to the facility. Per Nanny, patient will be moved to a different house where he will not have contact with the person (Jeremy Schultz) whom patient's HI is directed towards. At this time, Dr. Lucianne MussKumar feels that patient can be discharged back to [facility] Schultz to continue following up with his OP provider services.  Disposition:  Discharge home when medically cleared. No evidence of imminent risk to self or others at present.   Patient does not meet criteria for psychiatric inpatient admission. Supportive therapy provided about ongoing stressors. Refer to IOP. Discussed crisis plan, support from social network, calling 911, coming to the Emergency Department, and calling Suicide Hotline.  Jeremy A. Beryle Lathekonkwo, NP

## 2017-06-25 NOTE — Progress Notes (Addendum)
CSW spoke with Asa LenteNanny, Quarry managerHouse Manager at [facility] [phone]).  She stated that she is always able to manage pt's behavior as she knows him well and is able to tell when he "begins to go off" and gives him "PRN medication that helps calm him down.  Nanny stated, that while she did not know what set off this episode, as she was not working, she does know that no PRN was offered the pt in order to help him calm down as he is usually able to do.    [facility] and she stated, "Absolutely!".  CSW consulted with Leighton Ruffina Okonkwo, NP, and related above information.  CSW was asked to contact [facility] and relay pt's endorsing HI towards a Ms. Ross.  CS[facility] and spoke to [facility] to relate threat.  [facility], House Manger state that Ms. Tenny CrawRoss "was the person working this weekend and he really doesn't like her.  House Manager indicated that she wanted to contact [facility] and would call CSW back.  Ms. Bridgette HabermannHope called writer back and stated that [facility], Purvis KiltsDavid Humphries, will be moving pt to another bldg when he returns to the facility.  CSW informed Leighton Ruffina Okonkwo, NP who indicates that pt. does not meet inpatient criteria and can be discharged home. NP to write d/c orders.  CSW contacted [facility] ED Nurse, Aundra MilletMegan, to notify of discharge plan. Megan to contact [facility] to arrange discharge.  Timmothy EulerJean T. Kaylyn LimSutter, MSW, LCSWA Disposition Clinical Social Work [phone] (cell) [phone] (office)

## 2017-06-25 NOTE — BH Assessment (Signed)
CSW attempting to locate contact information for patient's group home.   Notes stated that patient is a resident at [facility] [phone]).   Notes from a previous admission (05/25/17),state that patient had a caretaker named Development worker, communityancy [facility].     CSW attempted to contact to gain collateral, however there was no answer and CSW was unable to leave voicemail.    CSW attempted to contact patient's DSS Legal Guardian/Social Worker, Darryl [phone],[phone]) to obtain collateral. No answer, left HIPAA compliant voicemail.   Darryl Christell ConstantMoore is noted to be the patient's DSS Legal Guardian in [place], [place].     CSW will continue to follow and attempt to gain collateral from legal guardian and group home facility.  Baldo DaubJolan Elodia Haviland MSW, LCSWA CSW Disposition [phone]

## 2017-06-25 NOTE — ED Notes (Signed)
Pt sleeping. 

## 2017-06-25 NOTE — ED Notes (Signed)
Pt provided lunch tray.

## 2017-06-25 NOTE — Progress Notes (Signed)
Patient's information has also been sent to the following facilities for review:   [facility]  TTS will continue to seek possible placement.   Baldo DaubJolan Nanci Lakatos MSW, LCSWA CSW Disposition [phone]

## 2017-06-25 NOTE — ED Provider Notes (Signed)
TTS has re-evaluated pt and SW/CM has also been involved with careplan:  Pt does not meet inpt criteria at this time and can be discharged back to his facility; facility agreeable to take pt back and will be moving pt to another house. Will d/c pt stable.     Jeremy Schultz, Fuller Makin, DO 06/25/17 1807

## 2017-06-25 NOTE — Progress Notes (Addendum)
CSW found another contact for [facility] [phone]- [phone].   2nd shift Disposition CSW will follow up for collateral.  Baldo DaubJolan Williams MSW, LCSWA CSW Disposition [phone]  CSW contacted number above and spoke with Elease HashimotoPatricia, Quarry managerHouse Manager.  Informed that pt lives in a sister bldg.  Number for that facility is [phone].  House Manager is Carlynn Purlanny  Caspar Favila T. Kaylyn LimSutter, MSW, LCSWA Disposition Clinical Social Work [phone] (cell) [phone] (office)

## 2017-06-25 NOTE — Discharge Instructions (Signed)
Take your usual prescriptions as previously directed.  Call your regular medical doctor and your mental health provider tomorrow to schedule a follow up appointment within the next week.  Return to the Emergency Department immediately sooner if worsening.  ° °

## 2017-07-16 ENCOUNTER — Encounter (HOSPITAL_COMMUNITY): Payer: Self-pay | Admitting: *Deleted

## 2017-07-16 ENCOUNTER — Emergency Department (HOSPITAL_COMMUNITY)
Admission: EM | Admit: 2017-07-16 | Discharge: 2017-07-17 | Disposition: A | Discharge status: Home / Self Care | Payer: Medicaid Other | Attending: Emergency Medicine | Admitting: Emergency Medicine

## 2017-07-16 ENCOUNTER — Emergency Department (HOSPITAL_COMMUNITY): Payer: Medicaid Other

## 2017-07-16 DIAGNOSIS — Y9384 Activity, sleeping: Secondary | ICD-10-CM | POA: Insufficient documentation

## 2017-07-16 DIAGNOSIS — F17200 Nicotine dependence, unspecified, uncomplicated: Secondary | ICD-10-CM | POA: Diagnosis not present

## 2017-07-16 DIAGNOSIS — I10 Essential (primary) hypertension: Secondary | ICD-10-CM | POA: Diagnosis not present

## 2017-07-16 DIAGNOSIS — Z7984 Long term (current) use of oral hypoglycemic drugs: Secondary | ICD-10-CM | POA: Insufficient documentation

## 2017-07-16 DIAGNOSIS — Y92129 Unspecified place in nursing home as the place of occurrence of the external cause: Secondary | ICD-10-CM | POA: Diagnosis not present

## 2017-07-16 DIAGNOSIS — W07XXXA Fall from chair, initial encounter: Secondary | ICD-10-CM | POA: Diagnosis not present

## 2017-07-16 DIAGNOSIS — F99 Mental disorder, not otherwise specified: Secondary | ICD-10-CM | POA: Insufficient documentation

## 2017-07-16 DIAGNOSIS — S0990XA Unspecified injury of head, initial encounter: Secondary | ICD-10-CM | POA: Diagnosis present

## 2017-07-16 DIAGNOSIS — E119 Type 2 diabetes mellitus without complications: Secondary | ICD-10-CM | POA: Insufficient documentation

## 2017-07-16 DIAGNOSIS — S0001XA Abrasion of scalp, initial encounter: Secondary | ICD-10-CM | POA: Insufficient documentation

## 2017-07-16 DIAGNOSIS — Y999 Unspecified external cause status: Secondary | ICD-10-CM | POA: Diagnosis not present

## 2017-07-16 DIAGNOSIS — Z79899 Other long term (current) drug therapy: Secondary | ICD-10-CM | POA: Diagnosis not present

## 2017-07-16 DIAGNOSIS — W19XXXA Unspecified fall, initial encounter: Secondary | ICD-10-CM

## 2017-07-16 NOTE — ED Provider Notes (Signed)
[facility]-EMERGENCY DEPT Provider Note   CSN: [id number] Arrival date & time: 07/16/17  1735     History   Chief Complaint Chief Complaint  Patient presents with  . Fall    HPI Jeremy Schultz is a 46 y.o. male.  Level V caveat for schizophrenia. Patient was sitting in a chair when he fell asleep and fell forward hitting the top of his head. He lives in assisted living facility. No change in behavior. No neck pain, truncal injury,  Severity of symptoms is mild.      Past Medical History:  Diagnosis Date  . Bipolar 1 disorder (HCC) 06/26/2016  . Diabetes mellitus without complication (HCC)   . Hypertension   . Schizophrenia (HCC)   . Seizures [facility])     Patient Active Problem List   Diagnosis Date Noted  . Suicidal ideation   . Diabetes mellitus without complication (HCC) 06/27/2016  . Schizophrenia (HCC) 09/01/2015  . High blood pressure 09/01/2015  . Seizure disorder (HCC) 09/01/2015    Past Surgical History:  Procedure Laterality Date  . APPENDECTOMY         Home Medications    Prior to Admission medications   Medication Sig Start Date End Date Taking? Authorizing Provider  acetaminophen (TYLENOL) 325 MG tablet Take 650 mg by mouth every 6 (six) hours as needed for mild pain or moderate pain.   Yes [provider]  benztropine (COGENTIN) 1 MG tablet Take 1 mg by mouth 2 (two) times daily.   Yes [provider]  cetirizine (ZYRTEC) 10 MG tablet Take 10 mg by mouth daily.   Yes [provider]  citalopram (CELEXA) 40 MG tablet Take 40 mg by mouth every morning.   Yes [provider]  clonazePAM (KLONOPIN) 0.5 MG tablet Take 0.5 mg by mouth 3 (three) times daily. *May take 1mg  by mouth once daily as needed for anxiety   Yes [provider]  clonazePAM (KLONOPIN) 1 MG tablet Take 1 mg by mouth daily as needed for anxiety.   Yes [provider]  divalproex (DEPAKOTE) 500 MG DR tablet Take 50-1,000 mg by mouth 2  (two) times daily. 500MG  IN THE MORNING AND 1000MG  AT BEDTIME   Yes [provider]  donepezil (ARICEPT) 5 MG tablet Take 5 mg by mouth at bedtime.   Yes [provider]  fluticasone (FLONASE) 50 MCG/ACT nasal spray Place 2 sprays into both nostrils daily.   Yes [provider]  gabapentin (NEURONTIN) 300 MG capsule Take 300 mg by mouth 2 (two) times daily.   Yes [provider]  lactulose (CHRONULAC) 10 GM/15ML solution Take 10 g by mouth daily.   Yes [provider]  metFORMIN (GLUCOPHAGE) 500 MG tablet Take 500 mg by mouth 2 (two) times daily with a meal.   Yes [provider]  methocarbamol (ROBAXIN) 500 MG tablet Take 500 mg by mouth 3 (three) times daily.   Yes [provider]  omeprazole (PRILOSEC) 20 MG capsule Take 40 mg by mouth 2 (two) times daily.    Yes [provider]  paliperidone (INVEGA SUSTENNA) 234 MG/1.5ML SUSP injection Inject 234 mg into the muscle every 30 (thirty) days.   Yes [provider]  prazosin (MINIPRESS) 1 MG capsule Take 1 mg by mouth 2 (two) times daily.   Yes [provider]  propranolol (INDERAL) 20 MG tablet Take 20 mg by mouth 3 (three) times daily.   Yes [provider]  QUEtiapine (SEROQUEL)  100 MG tablet Take 100 mg by mouth 2 (two) times daily.   Yes [provider]  Skin Protectants, Misc. (EUCERIN) cream Apply 1 application topically 2 (two) times daily.   Yes [provider]  sodium chloride (OCEAN) 0.65 % SOLN nasal spray Place 1 spray into both nostrils 3 (three) times daily as needed for congestion.    [provider]    Family History No family history on file.  Social History Social History  Substance Use Topics  . Smoking status: Current Every Day Smoker    Packs/day: 1.00  . Smokeless tobacco: Never Used  . Alcohol use No     Allergies   Patient has no known allergies.   Review of Systems Review of Systems    Unable to perform ROS: Psychiatric disorder     Physical Exam Updated Vital Signs BP 127/82 (BP Location: Left Arm)   Pulse (!) 54   Temp 97.6 F (36.4 C) (Oral)   Resp 18   Ht 5\' 10"  (1.778 m)   Wt 90.7 kg (200 lb)   SpO2 100%   BMI 28.70 kg/m   Physical Exam  Constitutional: He is oriented to person, place, and time. He appears well-developed and well-nourished.  HENT:  Head: Normocephalic.  Multiple abrasions on top of scalp  Eyes: Conjunctivae are normal.  Neck: Neck supple.  Cardiovascular: Normal rate and regular rhythm.   Pulmonary/Chest: Effort normal and breath sounds normal.  Abdominal: Soft. Bowel sounds are normal.  Musculoskeletal: Normal range of motion.  Neurological: He is alert and oriented to person, place, and time.  Skin: Skin is warm and dry.  Psychiatric: He has a normal mood and affect. His behavior is normal.  Nursing note and vitals reviewed.    ED Treatments / Results  Labs (all labs ordered are listed, but only abnormal results are displayed) Labs Reviewed - No data to display  EKG  EKG Interpretation None       Radiology Ct Head Wo Contrast  Result Date: 07/16/2017 CLINICAL DATA:  Larey Seat asleep sitting on front porch, struck head. History of hypertension, diabetes, seizures. EXAM: CT HEAD WITHOUT CONTRAST TECHNIQUE: Contiguous axial images were obtained from the base of the skull through the vertex without intravenous contrast. COMPARISON:  None. FINDINGS: BRAIN: No intraparenchymal hemorrhage, mass effect nor midline shift. Mild ventriculomegaly on the basis of global parenchymal brain volume loss as there is overall commensurate enlargement of cerebral sulci and cerebellar folia. No acute large vascular territory infarcts. No abnormal extra-axial fluid collections. Basal cisterns are patent. VASCULAR: Unremarkable. SKULL/SOFT TISSUES: No skull fracture. No significant soft tissue swelling. ORBITS/SINUSES: The included ocular globes and  orbital contents are normal.Mild paranasal sinus mucosal thickening without air-fluid levels. Mastoid air cells are well aerated. OTHER: Patient is edentulous. IMPRESSION: 1. No acute intracranial process. 2. Mild parenchymal brain volume loss for age. Electronically Signed   By: Awilda Metro M.D.   On: 07/16/2017 23:17    Procedures Procedures (including critical care time)  Medications Ordered in ED Medications - No data to display   Initial Impression / Assessment and Plan / ED Course  I have reviewed the triage vital signs and the nursing notes.  Pertinent labs & imaging results that were available during my care of the patient were reviewed by me and considered in my medical decision making (see chart for details).   CT head negative. His behavior is baseline. Will discharge home.    Final Clinical Impressions(s) / ED  Diagnoses   Final diagnoses:  Fall, initial encounter  Abrasion of scalp, initial encounter    New Prescriptions New Prescriptions   No medications on file     Donnetta Hutchingook, Ellianah Cordy, MD 07/16/17 2353

## 2017-07-16 NOTE — ED Triage Notes (Signed)
Pt was sitting on the front porch in a chair and fell asleep and fell forward out of the chair hitting the top of his head. Pt was brought in by [facility] from [facility].

## 2017-07-16 NOTE — Discharge Instructions (Signed)
CT scan of head was normal. You can shower. This wound will scab up over the next few days.

## 2017-07-29 ENCOUNTER — Ambulatory Visit: Payer: Medicaid Other | Admitting: Podiatry

## 2017-08-19 ENCOUNTER — Ambulatory Visit: Payer: Medicaid Other | Admitting: Podiatry

## 2017-09-05 ENCOUNTER — Ambulatory Visit (INDEPENDENT_AMBULATORY_CARE_PROVIDER_SITE_OTHER): Payer: Medicaid Other | Admitting: Podiatry

## 2017-09-05 ENCOUNTER — Encounter: Payer: Self-pay | Admitting: Podiatry

## 2017-09-05 DIAGNOSIS — B351 Tinea unguium: Secondary | ICD-10-CM

## 2017-09-05 DIAGNOSIS — M79676 Pain in unspecified toe(s): Secondary | ICD-10-CM

## 2017-09-05 DIAGNOSIS — M79609 Pain in unspecified limb: Principal | ICD-10-CM

## 2017-09-05 DIAGNOSIS — E1142 Type 2 diabetes mellitus with diabetic polyneuropathy: Secondary | ICD-10-CM

## 2017-09-05 NOTE — Progress Notes (Signed)
Complaint:  Visit Type: Patient returns to my office for continued preventative foot care services. Complaint: Patient states" my nails have grown long and thick and become painful to walk and wear shoes" Patient has been diagnosed with DM with neuropathy.. The patient presents for preventative foot care services. No changes to ROS  Podiatric Exam: Vascular: dorsalis pedis and posterior tibial pulses are palpable bilateral. Capillary return is immediate. Temperature gradient is WNL. Skin turgor WNL  Sensorium: Diminished  Semmes Weinstein monofilament test. Normal tactile sensation bilaterally. Nail Exam: Pt has thick disfigured discolored nails with subungual debris noted bilateral entire nail hallux through fifth toenails Ulcer Exam: There is no evidence of ulcer or pre-ulcerative changes or infection. Orthopedic Exam: Muscle tone and strength are WNL. No limitations in general ROM. No crepitus or effusions noted. Foot type and digits show no abnormalities. Bony prominences are unremarkable. Skin: No Porokeratosis. No infection or ulcers  Diagnosis:  Onychomycosis, , Pain in right toe, pain in left toes  Treatment & Plan Procedures and Treatment: Consent by patient was obtained for treatment procedures. The patient understood the discussion of treatment and procedures well. All questions were answered thoroughly reviewed. Debridement of mycotic and hypertrophic toenails, 1 through 5 bilateral and clearing of subungual debris. No ulceration, no infection noted.  Return Visit-Office Procedure: Patient instructed to return to the office for a follow up visit 3 months for continued evaluation and treatment.    Darlyne Schmiesing DPM 

## 2017-11-16 ENCOUNTER — Emergency Department (HOSPITAL_COMMUNITY)
Admission: EM | Admit: 2017-11-16 | Discharge: 2017-11-17 | Disposition: A | Discharge status: Home / Self Care | Payer: Medicaid Other | Attending: Emergency Medicine | Admitting: Emergency Medicine

## 2017-11-16 ENCOUNTER — Emergency Department (HOSPITAL_COMMUNITY): Payer: Medicaid Other

## 2017-11-16 ENCOUNTER — Other Ambulatory Visit: Payer: Self-pay

## 2017-11-16 ENCOUNTER — Encounter (HOSPITAL_COMMUNITY): Payer: Self-pay | Admitting: Emergency Medicine

## 2017-11-16 DIAGNOSIS — R569 Unspecified convulsions: Secondary | ICD-10-CM | POA: Diagnosis not present

## 2017-11-16 DIAGNOSIS — Y929 Unspecified place or not applicable: Secondary | ICD-10-CM | POA: Insufficient documentation

## 2017-11-16 DIAGNOSIS — E119 Type 2 diabetes mellitus without complications: Secondary | ICD-10-CM | POA: Diagnosis not present

## 2017-11-16 DIAGNOSIS — I10 Essential (primary) hypertension: Secondary | ICD-10-CM | POA: Insufficient documentation

## 2017-11-16 DIAGNOSIS — W01198A Fall on same level from slipping, tripping and stumbling with subsequent striking against other object, initial encounter: Secondary | ICD-10-CM | POA: Insufficient documentation

## 2017-11-16 DIAGNOSIS — F17210 Nicotine dependence, cigarettes, uncomplicated: Secondary | ICD-10-CM | POA: Diagnosis not present

## 2017-11-16 DIAGNOSIS — F319 Bipolar disorder, unspecified: Secondary | ICD-10-CM | POA: Insufficient documentation

## 2017-11-16 DIAGNOSIS — W19XXXA Unspecified fall, initial encounter: Secondary | ICD-10-CM

## 2017-11-16 DIAGNOSIS — Y939 Activity, unspecified: Secondary | ICD-10-CM | POA: Insufficient documentation

## 2017-11-16 DIAGNOSIS — Z7984 Long term (current) use of oral hypoglycemic drugs: Secondary | ICD-10-CM | POA: Diagnosis not present

## 2017-11-16 DIAGNOSIS — S0990XA Unspecified injury of head, initial encounter: Secondary | ICD-10-CM | POA: Diagnosis present

## 2017-11-16 DIAGNOSIS — F209 Schizophrenia, unspecified: Secondary | ICD-10-CM | POA: Diagnosis not present

## 2017-11-16 DIAGNOSIS — S0101XA Laceration without foreign body of scalp, initial encounter: Secondary | ICD-10-CM | POA: Diagnosis not present

## 2017-11-16 DIAGNOSIS — Z23 Encounter for immunization: Secondary | ICD-10-CM | POA: Diagnosis not present

## 2017-11-16 DIAGNOSIS — Y999 Unspecified external cause status: Secondary | ICD-10-CM | POA: Insufficient documentation

## 2017-11-16 DIAGNOSIS — Z79899 Other long term (current) drug therapy: Secondary | ICD-10-CM | POA: Insufficient documentation

## 2017-11-16 MED ORDER — LIDOCAINE-EPINEPHRINE (PF) 2 %-1:200000 IJ SOLN
20.0000 mL | Freq: Once | INTRAMUSCULAR | Status: AC
  Administered 2017-11-17: 20 mL
  Filled 2017-11-16: qty 20

## 2017-11-16 MED ORDER — TETANUS-DIPHTH-ACELL PERTUSSIS 5-2.5-18.5 LF-MCG/0.5 IM SUSP
0.5000 mL | Freq: Once | INTRAMUSCULAR | Status: AC
  Administered 2017-11-17: 0.5 mL via INTRAMUSCULAR
  Filled 2017-11-16: qty 0.5

## 2017-11-16 NOTE — ED Triage Notes (Signed)
Larey SeatFell out of chair outside/chair broke and hit head on concrete. No LOC, Remembers the event and after events. Pt complains of head ache. Put has a two inch laceration on the top left side of his head.

## 2017-11-16 NOTE — ED Provider Notes (Signed)
[facility] EMERGENCY DEPARTMENT Provider Note   CSN: [id number] Arrival date & time: 11/16/17  2232  Time seen 23:15 PM   History   Chief Complaint Chief Complaint  Patient presents with  . Fall    HPI Jeremy Schultz is a 46 y.o. male.  HPI patient states he was outside and he lost his balance and fell back and hit the back of his head on some cement.  He states he got himself up.  He denies any loss of consciousness.  He denies neck pain.  He states he is angry at himself or falling.  He denies suicidal or homicidal ideation.  He denies any visual changes, nausea, vomiting, or new weakness in his arms or legs.  He does not know when his last tetanus injection was, and when I check epic there are no prior tetanus injections.  PCP Unknown   Past Medical History:  Diagnosis Date  . Bipolar 1 disorder (HCC) 06/26/2016  . Diabetes mellitus without complication (HCC)   . Hypertension   . Schizophrenia (HCC)   . Seizures [facility](HCC)     Patient Active Problem List   Diagnosis Date Noted  . Suicidal ideation   . Diabetes mellitus without complication (HCC) 06/27/2016  . Schizophrenia (HCC) 09/01/2015  . High blood pressure 09/01/2015  . Seizure disorder (HCC) 09/01/2015    Past Surgical History:  Procedure Laterality Date  . APPENDECTOMY         Home Medications    Prior to Admission medications   Medication Sig Start Date End Date Taking? Authorizing Provider  acetaminophen (TYLENOL) 325 MG tablet Take 650 mg by mouth every 6 (six) hours as needed for mild pain or moderate pain.    [provider]  benztropine (COGENTIN) 1 MG tablet Take 1 mg by mouth 2 (two) times daily.    [provider]  cetirizine (ZYRTEC) 10 MG tablet Take 10 mg by mouth daily.    [provider]  citalopram (CELEXA) 40 MG tablet Take 40 mg by mouth every morning.    [provider]  clonazePAM (KLONOPIN) 0.5 MG tablet Take 0.5 mg by mouth 3 (three) times  daily. *May take 1mg  by mouth once daily as needed for anxiety    [provider]  clonazePAM (KLONOPIN) 1 MG tablet Take 1 mg by mouth daily as needed for anxiety.    [provider]  divalproex (DEPAKOTE) 500 MG DR tablet Take 50-1,000 mg by mouth 2 (two) times daily. 500MG  IN THE MORNING AND 1000MG  AT BEDTIME    [provider]  donepezil (ARICEPT) 5 MG tablet Take 5 mg by mouth at bedtime.    [provider]  fluticasone (FLONASE) 50 MCG/ACT nasal spray Place 2 sprays into both nostrils daily.    [provider]  gabapentin (NEURONTIN) 300 MG capsule Take 300 mg by mouth 2 (two) times daily.    [provider]  lactulose (CHRONULAC) 10 GM/15ML solution Take 10 g by mouth daily.    [provider]  metFORMIN (GLUCOPHAGE) 500 MG tablet Take 500 mg by mouth 2 (two) times daily with a meal.    [provider]  methocarbamol (ROBAXIN) 500 MG tablet Take 500 mg by mouth 3 (three) times daily.    [provider]  omeprazole (PRILOSEC) 20 MG capsule Take 40 mg by mouth 2 (two) times daily.     [provider]  paliperidone (INVEGA SUSTENNA) 234 MG/1.5ML SUSP injection Inject 234 mg  into the muscle every 30 (thirty) days.    [provider]  prazosin (MINIPRESS) 1 MG capsule Take 1 mg by mouth 2 (two) times daily.    [provider]  propranolol (INDERAL) 20 MG tablet Take 20 mg by mouth 3 (three) times daily.    [provider]  QUEtiapine (SEROQUEL) 100 MG tablet Take 100 mg by mouth 2 (two) times daily.    [provider]  Skin Protectants, Misc. (EUCERIN) cream Apply 1 application topically 2 (two) times daily.    [provider]  sodium chloride (OCEAN) 0.65 % SOLN nasal spray Place 1 spray into both nostrils 3 (three) times daily as needed for congestion.    [provider]    Family History History reviewed. No pertinent family history.  Social  History Social History   Tobacco Use  . Smoking status: Current Every Day Smoker    Packs/day: 1.00  . Smokeless tobacco: Never Used  Substance Use Topics  . Alcohol use: No  . Drug use: No  lives in [facility]   Allergies   Patient has no known allergies.   Review of Systems Review of Systems  All other systems reviewed and are negative.    Physical Exam Updated Vital Signs BP 120/85 (BP Location: Right Arm)   Pulse 75   Temp 98 F (36.7 C) (Oral)   Resp 20   SpO2 99%   Vital signs normal    Physical Exam  Constitutional: He is oriented to person, place, and time. He appears well-developed and well-nourished.  HENT:  Head: Normocephalic.  Right Ear: External ear normal.  Left Ear: External ear normal.  Nose: Nose normal.  Patient has a 3 cm linear laceration on his left posterior scalp, please see photo Patient is edentulous and his speech is hard to understand.  Eyes: Conjunctivae and EOM are normal. Pupils are equal, round, and reactive to light.  Neck: Normal range of motion. Neck supple.  Cardiovascular: Normal rate.  Pulmonary/Chest: Effort normal. No respiratory distress.  Musculoskeletal: He exhibits no tenderness or deformity.  Neurological: He is alert and oriented to person, place, and time. No cranial nerve deficit.  Skin: Skin is warm and dry. No erythema.  Psychiatric: He has a normal mood and affect. His behavior is normal. Thought content normal.  Nursing note and vitals reviewed.      ED Treatments / Results  Labs (all labs ordered are listed, but only abnormal results are displayed) Labs Reviewed - No data to display  EKG  EKG Interpretation None       Radiology Ct Head Wo Contrast  Result Date: 11/17/2017 CLINICAL DATA:  Fall from chair with head trauma. Headache. No loss of consciousness. EXAM: CT HEAD WITHOUT CONTRAST TECHNIQUE: Contiguous axial images were obtained from the base of the skull through the vertex  without intravenous contrast. COMPARISON:  Head CT 07/16/2017 FINDINGS: Brain: Stable generalized atrophy. No intracranial hemorrhage, mass effect, or midline shift. No hydrocephalus. The basilar cisterns are patent. No evidence of territorial infarct or acute ischemia. No extra-axial or intracranial fluid collection. Vascular: No hyperdense vessel. Skull: No fracture or focal lesion. Sinuses/Orbits: Mild chronic mucosal thickening of left maxillary sinus. No sinus fluid levels. Minimal rightward nasal septal deviation. The visualized orbits are unremarkable. Other: Small left parietal scalp laceration. IMPRESSION: 1. Small left parietal scalp laceration. No acute intracranial abnormality. No skull fracture. 2. Stable atrophy. Electronically Signed   By: Lujean Rave.D.  On: 11/17/2017 00:11    Procedures .Marland Kitchen.Laceration Repair Date/Time: 11/17/2017 1:02 AM Performed by: Devoria AlbeKnapp, Jocob Dambach, MD Authorized by: Devoria AlbeKnapp, Shanika Levings, MD   Consent:    Consent obtained:  Verbal   Consent given by:  Patient   Risks discussed:  Pain   Alternatives discussed:  No treatment Anesthesia (see MAR for exact dosages):    Anesthesia method:  Local infiltration   Local anesthetic:  Lidocaine 2% WITH epi Laceration details:    Location:  Scalp   Scalp location: Left posterior.   Length (cm):  3 Repair type:    Repair type:  Simple Exploration:    Hemostasis achieved with:  Direct pressure   Wound exploration: entire depth of wound probed and visualized     Wound extent: no foreign bodies/material noted and no underlying fracture noted     Contaminated: no   Treatment:    Area cleansed with:  Saline   Amount of cleaning:  Standard Skin repair:    Repair method:  Staples   Number of staples:  5 Approximation:    Approximation:  Close   Vermilion border: well-aligned   Post-procedure details:    Dressing:  Antibiotic ointment and non-adherent dressing   Patient tolerance of procedure:  Tolerated well, no  immediate complications   (including critical care time)  Medications Ordered in ED Medications  Tdap (BOOSTRIX) injection 0.5 mL (0.5 mLs Intramuscular Given 11/17/17 0008)  lidocaine-EPINEPHrine (XYLOCAINE W/EPI) 2 %-1:200000 (PF) injection 20 mL (20 mLs Infiltration Given 11/17/17 0009)     Initial Impression / Assessment and Plan / ED Course  I have reviewed the triage vital signs and the nursing notes.  Pertinent labs & imaging results that were available during my care of the patient were reviewed by me and considered in my medical decision making (see chart for details).    Due to patient falling a CT of the head was done.  After reviewing his head CT staples were placed.  Patient was released home.   Final Clinical Impressions(s) / ED Diagnoses   Final diagnoses:  Fall, initial encounter  Laceration of occipital region of scalp, initial encounter    ED Discharge Orders    None      Plan discharge  Devoria AlbeIva Malkia Nippert, MD, Concha PyoFACEP    Raymonda Pell, MD 11/17/17 [phone]

## 2017-11-16 NOTE — ED Notes (Signed)
Per EMS pt is not on blood thinners.

## 2017-11-17 NOTE — Discharge Instructions (Signed)
You may wash her hair to get the blood out thin keep the wound clean and dry.  You can use antibiotic ointment on the laceration.  The staples need to be removed in 1 week.  Return to the emergency department for any problems listed on the head injury sheet.

## 2017-11-17 NOTE — ED Notes (Signed)
Called facility to pickup patient, per attendant she couldn't get in touch with anyone to pick him up, I informed her the facility would be charge for transport, she said okay.

## 2017-12-02 ENCOUNTER — Other Ambulatory Visit: Payer: Self-pay

## 2017-12-02 ENCOUNTER — Encounter (HOSPITAL_COMMUNITY): Payer: Self-pay | Admitting: Emergency Medicine

## 2017-12-02 ENCOUNTER — Emergency Department (HOSPITAL_COMMUNITY)
Admission: EM | Admit: 2017-12-02 | Discharge: 2017-12-02 | Disposition: A | Discharge status: Home / Self Care | Payer: Medicaid Other | Attending: Emergency Medicine | Admitting: Emergency Medicine

## 2017-12-02 DIAGNOSIS — Z4802 Encounter for removal of sutures: Secondary | ICD-10-CM

## 2017-12-02 DIAGNOSIS — Z7984 Long term (current) use of oral hypoglycemic drugs: Secondary | ICD-10-CM | POA: Insufficient documentation

## 2017-12-02 DIAGNOSIS — I10 Essential (primary) hypertension: Secondary | ICD-10-CM | POA: Insufficient documentation

## 2017-12-02 DIAGNOSIS — Z79899 Other long term (current) drug therapy: Secondary | ICD-10-CM | POA: Insufficient documentation

## 2017-12-02 DIAGNOSIS — F17200 Nicotine dependence, unspecified, uncomplicated: Secondary | ICD-10-CM | POA: Insufficient documentation

## 2017-12-02 DIAGNOSIS — E119 Type 2 diabetes mellitus without complications: Secondary | ICD-10-CM | POA: Diagnosis not present

## 2017-12-02 NOTE — ED Provider Notes (Signed)
[facility] EMERGENCY DEPARTMENT Provider Note   CSN: [id number] Arrival date & time: 12/02/17  4098     History   Chief Complaint Chief Complaint  Patient presents with  . Suture / Staple Removal    HPI Jeremy Schultz is a 46 y.o. male.  HPI   Jeremy Schultz is a 46 y.o. male who presents to the Emergency Department requesting staple removal.  He was seen here on 11/16/17 after a fall in which he suffered a laceration to his scalp.  He had 5 staples placed.  He denies pain, redness and swelling to the site.  States the wound has been healing well. Denies other complications  Past Medical History:  Diagnosis Date  . Bipolar 1 disorder (HCC) 06/26/2016  . Diabetes mellitus without complication (HCC)   . Hypertension   . Schizophrenia (HCC)   . Seizures [facility])     Patient Active Problem List   Diagnosis Date Noted  . Suicidal ideation   . Diabetes mellitus without complication (HCC) 06/27/2016  . Schizophrenia (HCC) 09/01/2015  . High blood pressure 09/01/2015  . Seizure disorder (HCC) 09/01/2015    Past Surgical History:  Procedure Laterality Date  . APPENDECTOMY         Home Medications    Prior to Admission medications   Medication Sig Start Date End Date Taking? Authorizing Provider  acetaminophen (TYLENOL) 325 MG tablet Take 650 mg by mouth every 6 (six) hours as needed for mild pain or moderate pain.    [provider]  benztropine (COGENTIN) 1 MG tablet Take 1 mg by mouth 2 (two) times daily.    [provider]  cetirizine (ZYRTEC) 10 MG tablet Take 10 mg by mouth daily.    [provider]  citalopram (CELEXA) 40 MG tablet Take 40 mg by mouth every morning.    [provider]  clonazePAM (KLONOPIN) 0.5 MG tablet Take 0.5 mg by mouth 3 (three) times daily. *May take 1mg  by mouth once daily as needed for anxiety    [provider]  clonazePAM (KLONOPIN) 1 MG tablet Take 1 mg by mouth daily as needed for  anxiety.    [provider]  divalproex (DEPAKOTE) 500 MG DR tablet Take 50-1,000 mg by mouth 2 (two) times daily. 500MG  IN THE MORNING AND 1000MG  AT BEDTIME    [provider]  donepezil (ARICEPT) 5 MG tablet Take 5 mg by mouth at bedtime.    [provider]  fluticasone (FLONASE) 50 MCG/ACT nasal spray Place 2 sprays into both nostrils daily.    [provider]  gabapentin (NEURONTIN) 300 MG capsule Take 300 mg by mouth 2 (two) times daily.    [provider]  lactulose (CHRONULAC) 10 GM/15ML solution Take 10 g by mouth daily.    [provider]  metFORMIN (GLUCOPHAGE) 500 MG tablet Take 500 mg by mouth 2 (two) times daily with a meal.    [provider]  methocarbamol (ROBAXIN) 500 MG tablet Take 500 mg by mouth 3 (three) times daily.    [provider]  omeprazole (PRILOSEC) 20 MG capsule Take 40 mg by mouth 2 (two) times daily.     [provider]  paliperidone (INVEGA SUSTENNA) 234 MG/1.5ML SUSP injection Inject 234 mg into the muscle every 30 (thirty) days.    [provider]  prazosin (MINIPRESS) 1 MG capsule Take 1 mg by mouth 2 (two) times daily.    [provider]  propranolol (  INDERAL) 20 MG tablet Take 20 mg by mouth 3 (three) times daily.    [provider]  QUEtiapine (SEROQUEL) 100 MG tablet Take 100 mg by mouth 2 (two) times daily.    [provider]  Skin Protectants, Misc. (EUCERIN) cream Apply 1 application topically 2 (two) times daily.    [provider]  sodium chloride (OCEAN) 0.65 % SOLN nasal spray Place 1 spray into both nostrils 3 (three) times daily as needed for congestion.    [provider]    Family History History reviewed. No pertinent family history.  Social History Social History   Tobacco Use  . Smoking status: Current Every Day Smoker    Packs/day: 1.00  . Smokeless tobacco: Never Used  Substance Use Topics  . Alcohol  use: No  . Drug use: No     Allergies   Patient has no known allergies.   Review of Systems Review of Systems  Constitutional: Negative for chills and fever.  Musculoskeletal: Negative for arthralgias, back pain and joint swelling.  Skin: Positive for wound.       Laceration scalp with staples in place  Neurological: Negative for dizziness, weakness and numbness.  Hematological: Does not bruise/bleed easily.  All other systems reviewed and are negative.    Physical Exam Updated Vital Signs BP (!) 141/85 (BP Location: Right Arm)   Pulse 84   Temp 98.5 F (36.9 C) (Oral)   Resp 18   Ht 5\' 6"  (1.676 m)   Wt 90.7 kg (200 lb)   SpO2 96%   BMI 32.28 kg/m   Physical Exam  Constitutional: He is oriented to person, place, and time. He appears well-developed and well-nourished. No distress.  HENT:  Head: Normocephalic.  Scabbed laceration to the left posterior scalp with 5 staples in place.  Appears to be healing well.  No edema or surrounding erythema.   Neck: Normal range of motion.  Cardiovascular: Normal rate, regular rhythm and intact distal pulses.  No murmur heard. Pulmonary/Chest: Effort normal and breath sounds normal. No respiratory distress.  Musculoskeletal: He exhibits no edema or tenderness.  Neurological: He is alert and oriented to person, place, and time. He exhibits normal muscle tone. Coordination normal.  Skin: Skin is warm.  Nursing note and vitals reviewed.    ED Treatments / Results  Labs (all labs ordered are listed, but only abnormal results are displayed) Labs Reviewed - No data to display  EKG  EKG Interpretation None       Radiology No results found.  Procedures Procedures (including critical care time)  SUTURE REMOVAL Performed by: Pauline AusRIPLETT,Wallice Granville L.  Consent: Verbal consent obtained. Consent given by: patient Required items: required blood products, implants, devices, and special equipment available Time out: Immediately  prior to procedure a "time out" was called to verify the correct patient, procedure, equipment, support staff and site/side marked as required.  Location: left posterior scalp  Wound Appearance: scabbed, but healing well  Sutures/Staples Removed: 5  Patient tolerance: Patient tolerated the procedure well with no immediate complications.    Medications Ordered in ED Medications - No data to display   Initial Impression / Assessment and Plan / ED Course  I have reviewed the triage vital signs and the nursing notes.  Pertinent labs & imaging results that were available during my care of the patient were reviewed by me and considered in my medical decision making (see chart for details).     Staples removed by me without difficulty.  After care instructions discussed.    Final Clinical Impressions(s) / ED Diagnoses   Final diagnoses:  Encounter for staple removal    ED Discharge Orders    None       Pauline Ausriplett, Dacotah Cabello, PA-C 12/02/17 1017    Jacalyn LefevreHaviland, Julie, MD 12/02/17 1019

## 2017-12-02 NOTE — Discharge Instructions (Signed)
Gently clean the area with mild soap and water.  Return here if needed.

## 2017-12-02 NOTE — ED Triage Notes (Signed)
Pt reports had staples inserted x2 weeks ago. Pt reports is here today to have them removed. PA in triage assessing site and removing staples. Pt tolerated well.

## 2018-01-30 IMAGING — CT CT HEAD W/O CM
4 series · 16 of 47 positions shown, 18 images · non-contrast
Comparison: None.

CLINICAL DATA: Fell asleep sitting on front porch, struck head.
History of hypertension, diabetes, seizures.

EXAM:
CT HEAD WITHOUT CONTRAST
TECHNIQUE: Contiguous axial images were obtained from the base of the skull
through the vertex without intravenous contrast.

[Series 2: head trauma wo · axial · 0.43mm/px · z∈[+1360,+1490]mm · 7 of 36 slices shown, 9 images]
[im 5/36  brain]
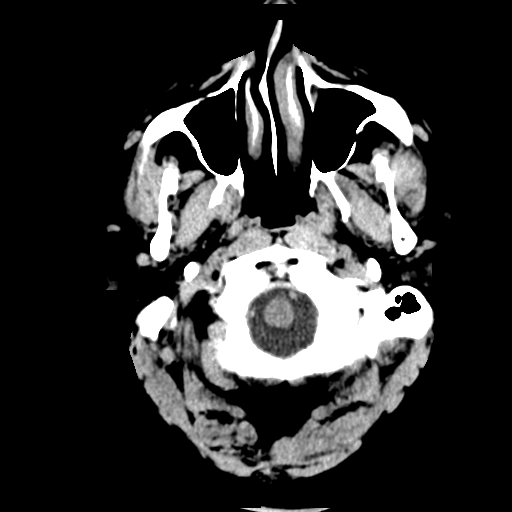
[im 5/36  bone]
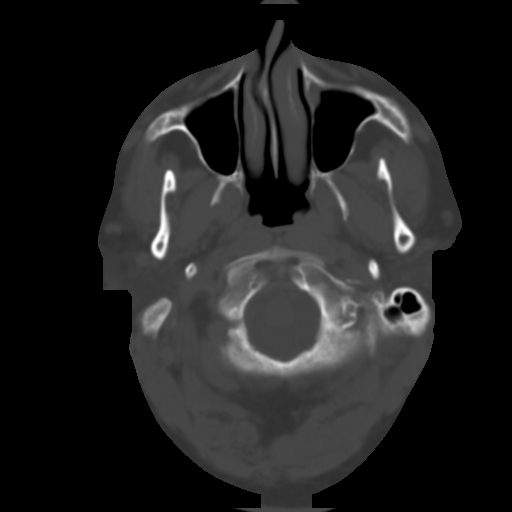
[im 9/36  brain]
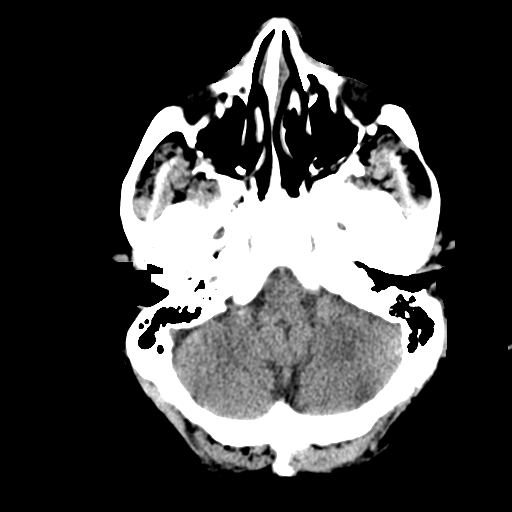
[im 14/36  brain]
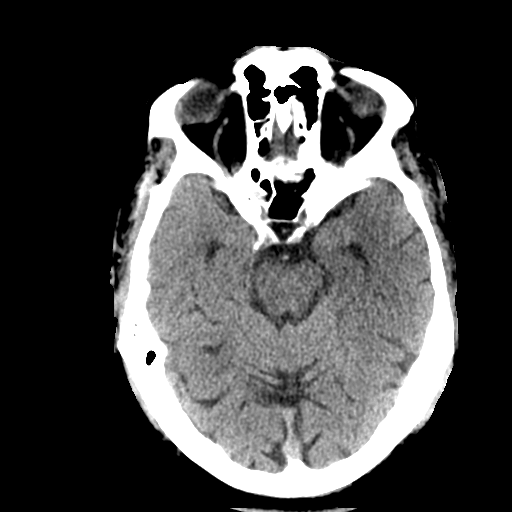
[im 18/36  brain]
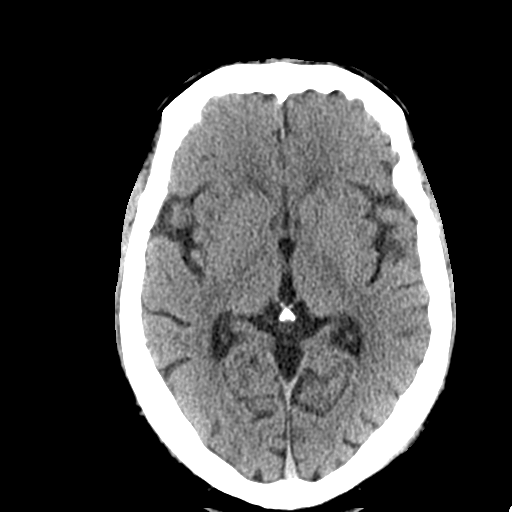
[im 22/36  brain]
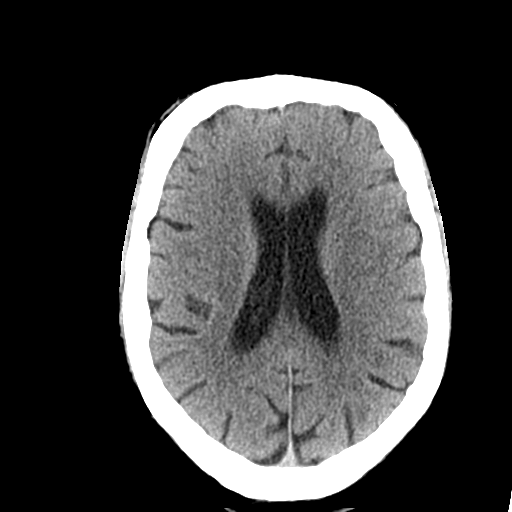
[im 22/36  bone]
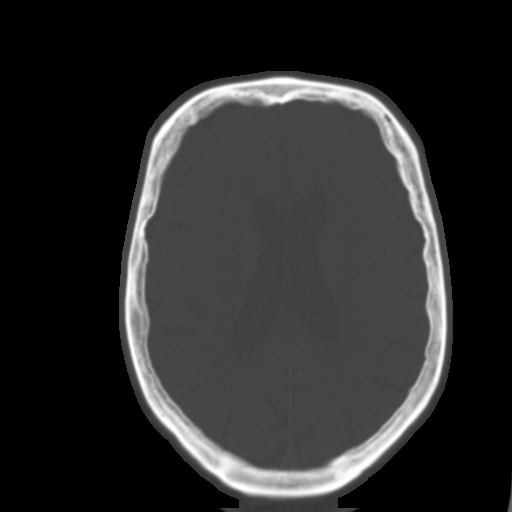
[im 27/36  brain]
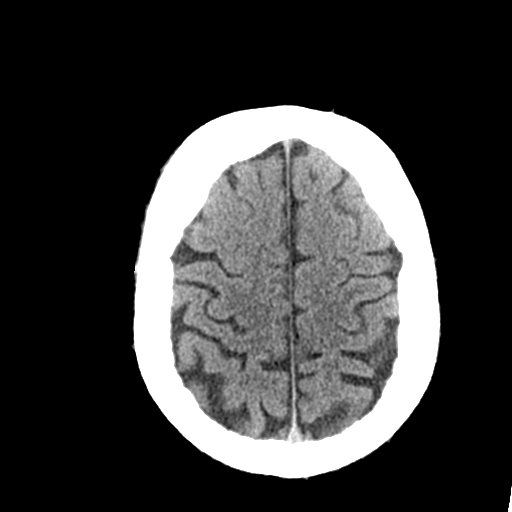
[im 31/36  brain]
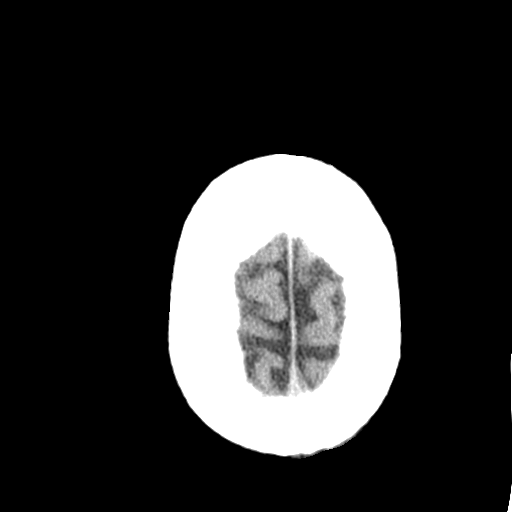

[Series 3: head bone · axial · 0.45mm/px · z∈[+1355,+1391]mm · 3 of 89 slices shown]
[im 9/89  bone]
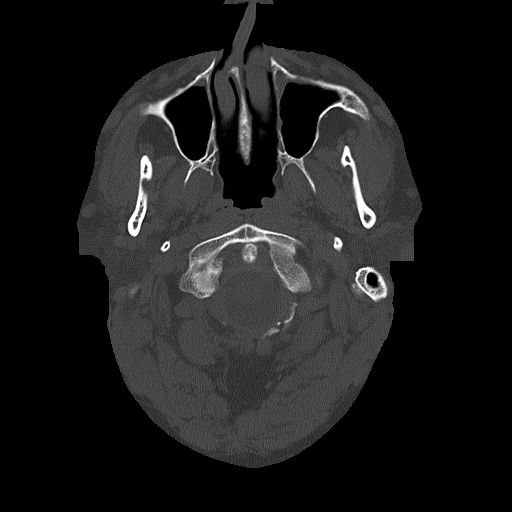
[im 18/89  bone]
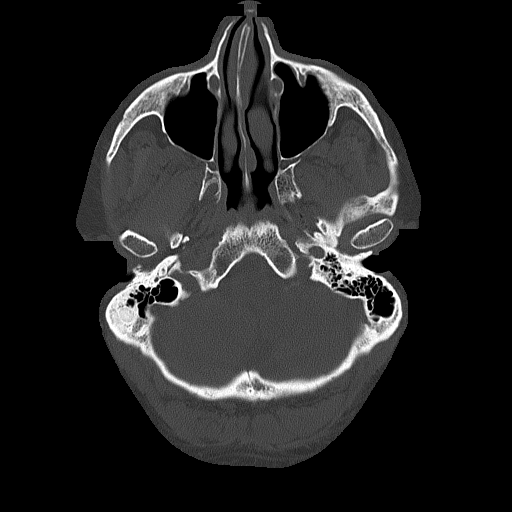
[im 27/89  bone]
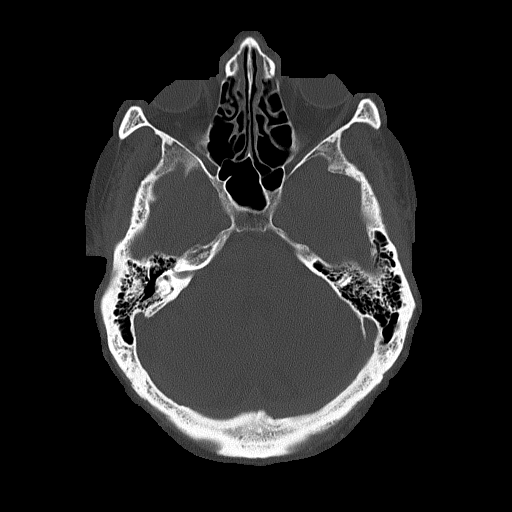

[Series 4: coronal soft tissue · coronal · 0.34mm/px · 3 of 74 slices shown]
[im 25/74  brain]
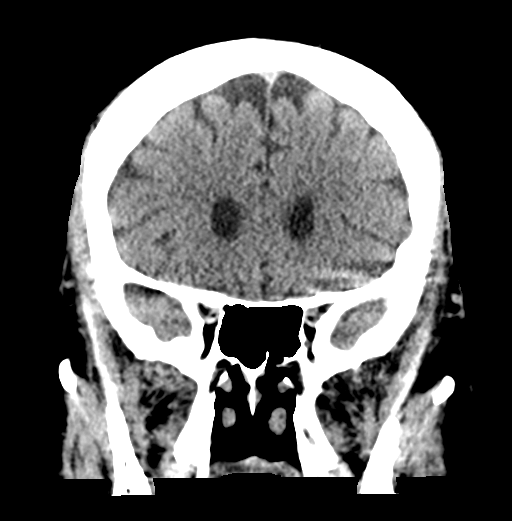
[im 33/74  brain]
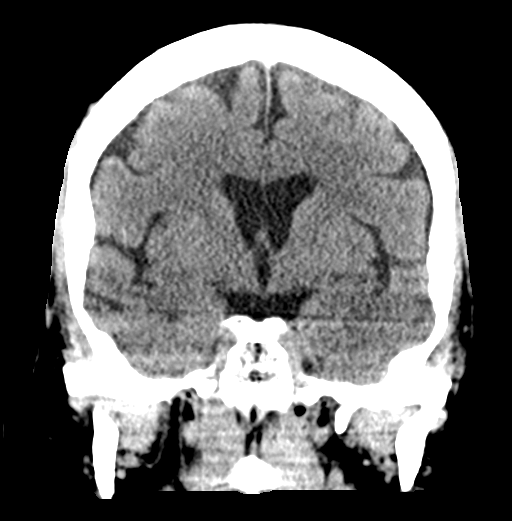
[im 41/74  brain]
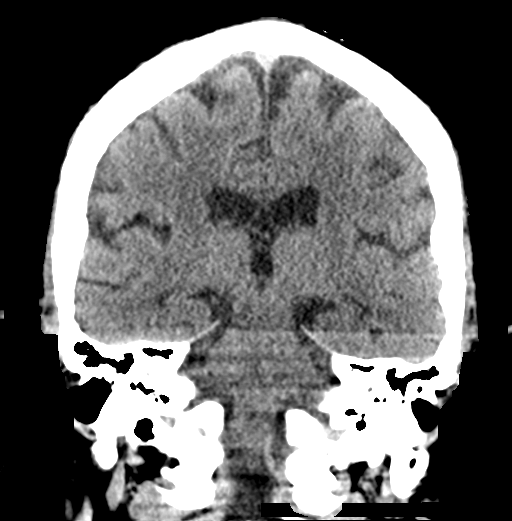

[Series 5: sagittal soft tissue · sagittal · 0.35mm/px · 3 of 57 slices shown]
[im 19/57  brain]
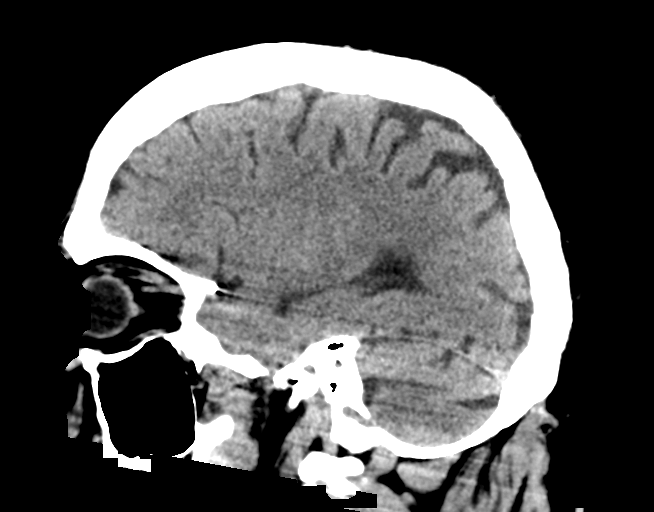
[im 29/57  brain]
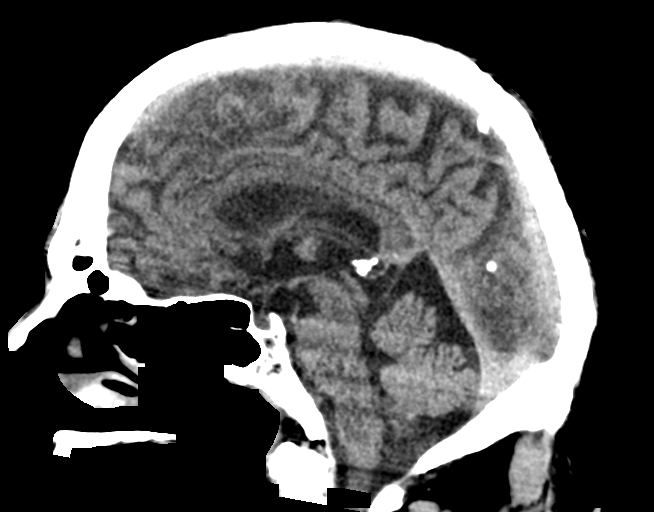
[im 38/57  brain]
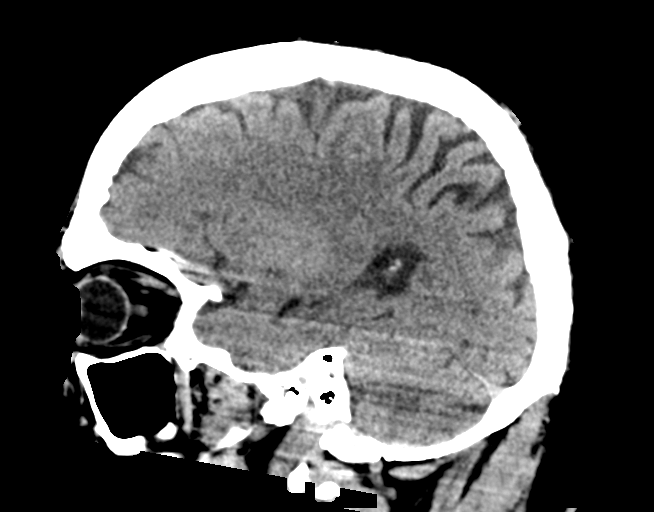

[16 of 47 positions shown; findings below may reference images not displayed]

FINDINGS: BRAIN: No intraparenchymal hemorrhage, mass effect nor midline
shift. Mild ventriculomegaly on the basis of global parenchymal
brain volume loss as there is overall commensurate enlargement of
cerebral sulci and cerebellar folia. No acute large vascular
territory infarcts. No abnormal extra-axial fluid collections. Basal
cisterns are patent.

VASCULAR: Unremarkable.

SKULL/SOFT TISSUES: No skull fracture. No significant soft tissue
swelling.

ORBITS/SINUSES: The included ocular globes and orbital contents are
normal.Mild paranasal sinus mucosal thickening without air-fluid
levels. Mastoid air cells are well aerated.

OTHER: Patient is edentulous.
IMPRESSION: 1. No acute intracranial process.
2. Mild parenchymal brain volume loss for age.

## 2018-09-30 ENCOUNTER — Emergency Department (HOSPITAL_COMMUNITY)
Admission: EM | Admit: 2018-09-30 | Discharge: 2018-09-30 | Disposition: A | Discharge status: Home / Self Care | Payer: Medicaid Other | Attending: Emergency Medicine | Admitting: Emergency Medicine

## 2018-09-30 ENCOUNTER — Emergency Department (HOSPITAL_COMMUNITY): Payer: Medicaid Other

## 2018-09-30 ENCOUNTER — Encounter (HOSPITAL_COMMUNITY): Payer: Self-pay

## 2018-09-30 ENCOUNTER — Other Ambulatory Visit: Payer: Self-pay

## 2018-09-30 DIAGNOSIS — F17200 Nicotine dependence, unspecified, uncomplicated: Secondary | ICD-10-CM | POA: Diagnosis not present

## 2018-09-30 DIAGNOSIS — I10 Essential (primary) hypertension: Secondary | ICD-10-CM | POA: Insufficient documentation

## 2018-09-30 DIAGNOSIS — R1084 Generalized abdominal pain: Secondary | ICD-10-CM | POA: Insufficient documentation

## 2018-09-30 DIAGNOSIS — Z79899 Other long term (current) drug therapy: Secondary | ICD-10-CM | POA: Diagnosis not present

## 2018-09-30 DIAGNOSIS — E119 Type 2 diabetes mellitus without complications: Secondary | ICD-10-CM | POA: Diagnosis not present

## 2018-09-30 DIAGNOSIS — Z7984 Long term (current) use of oral hypoglycemic drugs: Secondary | ICD-10-CM | POA: Diagnosis not present

## 2018-09-30 DIAGNOSIS — R1033 Periumbilical pain: Secondary | ICD-10-CM | POA: Diagnosis present

## 2018-09-30 DIAGNOSIS — R109 Unspecified abdominal pain: Secondary | ICD-10-CM

## 2018-09-30 LAB — COMPREHENSIVE METABOLIC PANEL
ALT: 18 U/L (ref 0–44)
AST: 25 U/L (ref 15–41)
Albumin: 3.9 g/dL (ref 3.5–5.0)
Alkaline Phosphatase: 36 U/L — ABNORMAL LOW (ref 38–126)
Anion gap: 8 (ref 5–15)
BILIRUBIN TOTAL: 0.6 mg/dL (ref 0.3–1.2)
BUN: 16 mg/dL (ref 6–20)
CHLORIDE: 95 mmol/L — AB (ref 98–111)
CO2: 29 mmol/L (ref 22–32)
CREATININE: 0.89 mg/dL (ref 0.61–1.24)
Calcium: 9.7 mg/dL (ref 8.9–10.3)
Glucose, Bld: 94 mg/dL (ref 70–99)
Potassium: 4.6 mmol/L (ref 3.5–5.1)
Sodium: 132 mmol/L — ABNORMAL LOW (ref 135–145)
TOTAL PROTEIN: 7.8 g/dL (ref 6.5–8.1)

## 2018-09-30 LAB — URINALYSIS, ROUTINE W REFLEX MICROSCOPIC
Bilirubin Urine: NEGATIVE
Glucose, UA: NEGATIVE mg/dL
Hgb urine dipstick: NEGATIVE
KETONES UR: 5 mg/dL — AB
LEUKOCYTES UA: NEGATIVE
Nitrite: NEGATIVE
PROTEIN: NEGATIVE mg/dL
Specific Gravity, Urine: 1.027 (ref 1.005–1.030)
pH: 5 (ref 5.0–8.0)

## 2018-09-30 LAB — CBC
HCT: 39.1 % (ref 39.0–52.0)
Hemoglobin: 13.1 g/dL (ref 13.0–17.0)
MCH: 31.2 pg (ref 26.0–34.0)
MCHC: 33.5 g/dL (ref 30.0–36.0)
MCV: 93.1 fL (ref 80.0–100.0)
NRBC: 0 % (ref 0.0–0.2)
PLATELETS: 139 10*3/uL — AB (ref 150–400)
RBC: 4.2 MIL/uL — ABNORMAL LOW (ref 4.22–5.81)
RDW: 13.7 % (ref 11.5–15.5)
WBC: 3.2 10*3/uL — ABNORMAL LOW (ref 4.0–10.5)

## 2018-09-30 LAB — LIPASE, BLOOD: LIPASE: 33 U/L (ref 11–51)

## 2018-09-30 MED ORDER — IOPAMIDOL (ISOVUE-300) INJECTION 61%
100.0000 mL | Freq: Once | INTRAVENOUS | Status: AC | PRN
  Administered 2018-09-30: 100 mL via INTRAVENOUS

## 2018-09-30 NOTE — ED Provider Notes (Signed)
[facility] EMERGENCY DEPARTMENT Provider Note   CSN: [id number] Arrival date & time: 09/30/18  1216     History   Chief Complaint Chief Complaint  Patient presents with  . Abdominal Pain    HPI Jeremy Schultz is a 47 y.o. male.  The history is provided by the patient and the EMS personnel. The history is limited by the condition of the patient (Hx schizophrenia).  Abdominal Pain    Pt was seen at 1505. Per EMS, group home and pt: Pt c/o generalized abd "pains" for the past 1 week. Has been associated with diarrhea. Pt states he does not remember when the diarrhea started. Abd pain is located in his periumbilical area. Denies N/V, no CP/SOB, no flank pain, no testicular pain/swelling, no dysuria/hematuria, no fevers, no rash, no black or blood in stools.   Past Medical History:  Diagnosis Date  . Bipolar 1 disorder (HCC) 06/26/2016  . Diabetes mellitus without complication (HCC)   . Hypertension   . Schizophrenia (HCC)   . Seizures [facility])     Patient Active Problem List   Diagnosis Date Noted  . Suicidal ideation   . Diabetes mellitus without complication (HCC) 06/27/2016  . Schizophrenia (HCC) 09/01/2015  . High blood pressure 09/01/2015  . Seizure disorder (HCC) 09/01/2015    Past Surgical History:  Procedure Laterality Date  . APPENDECTOMY          Home Medications    Prior to Admission medications   Medication Sig Start Date End Date Taking? Authorizing Provider  acetaminophen (TYLENOL) 325 MG tablet Take 650 mg by mouth every 6 (six) hours as needed for mild pain or moderate pain.    [provider]  benztropine (COGENTIN) 1 MG tablet Take 1 mg by mouth 2 (two) times daily.    [provider]  cetirizine (ZYRTEC) 10 MG tablet Take 10 mg by mouth daily.    [provider]  citalopram (CELEXA) 40 MG tablet Take 40 mg by mouth every morning.    [provider]  clonazePAM (KLONOPIN) 0.5 MG tablet Take 0.5 mg by mouth 3  (three) times daily. *May take 1mg  by mouth once daily as needed for anxiety    [provider]  clonazePAM (KLONOPIN) 1 MG tablet Take 1 mg by mouth daily as needed for anxiety.    [provider]  divalproex (DEPAKOTE) 500 MG DR tablet Take 50-1,000 mg by mouth 2 (two) times daily. 500MG  IN THE MORNING AND 1000MG  AT BEDTIME    [provider]  donepezil (ARICEPT) 5 MG tablet Take 5 mg by mouth at bedtime.    [provider]  fluticasone (FLONASE) 50 MCG/ACT nasal spray Place 2 sprays into both nostrils daily.    [provider]  gabapentin (NEURONTIN) 300 MG capsule Take 300 mg by mouth 2 (two) times daily.    [provider]  lactulose (CHRONULAC) 10 GM/15ML solution Take 10 g by mouth daily.    [provider]  metFORMIN (GLUCOPHAGE) 500 MG tablet Take 500 mg by mouth 2 (two) times daily with a meal.    [provider]  methocarbamol (ROBAXIN) 500 MG tablet Take 500 mg by mouth 3 (three) times daily.    [provider]  omeprazole (PRILOSEC) 20 MG capsule Take 40 mg by mouth 2 (two) times daily.     [provider]  paliperidone (INVEGA SUSTENNA) 234 MG/1.5ML SUSP injection Inject 234 mg into the muscle every 30 (thirty) days.  [provider]  prazosin (MINIPRESS) 1 MG capsule Take 1 mg by mouth 2 (two) times daily.    [provider]  propranolol (INDERAL) 20 MG tablet Take 20 mg by mouth 3 (three) times daily.    [provider]  QUEtiapine (SEROQUEL) 100 MG tablet Take 100 mg by mouth 2 (two) times daily.    [provider]  Skin Protectants, Misc. (EUCERIN) cream Apply 1 application topically 2 (two) times daily.    [provider]  sodium chloride (OCEAN) 0.65 % SOLN nasal spray Place 1 spray into both nostrils 3 (three) times daily as needed for congestion.    [provider]    Family History No family history on file.  Social  History Social History   Tobacco Use  . Smoking status: Current Every Day Smoker    Packs/day: 1.00  . Smokeless tobacco: Never Used  Substance Use Topics  . Alcohol use: No  . Drug use: No     Allergies   Patient has no known allergies.   Review of Systems Review of Systems  Unable to perform ROS: Psychiatric disorder  Gastrointestinal: Positive for abdominal pain.     Physical Exam Updated Vital Signs BP 126/79   Pulse 65   Temp 97.7 F (36.5 C) (Oral)   Resp 18   Ht 5\' 10"  (1.778 m)   Wt 95.3 kg   SpO2 100%   BMI 30.13 kg/m   Physical Exam 1510: Physical examination:  Nursing notes reviewed; Vital signs and O2 SAT reviewed;  Constitutional: Well developed, Well nourished, Well hydrated, In no acute distress; Head:  Normocephalic, atraumatic; Eyes: EOMI, PERRL, No scleral icterus; ENMT: Mouth and pharynx normal, Mucous membranes moist; Neck: Supple, Full range of motion, No lymphadenopathy; Cardiovascular: Regular rate and rhythm, No gallop; Respiratory: Breath sounds clear & equal bilaterally, No wheezes.  Speaking full sentences with ease, Normal respiratory effort/excursion; Chest: Nontender, Movement normal; Abdomen: Soft, +very mild periumbilical tenderness to palp. No rebound or guarding. Nondistended, Normal bowel sounds; Genitourinary: No CVA tenderness; Extremities: Peripheral pulses normal, No tenderness, No edema, No calf edema or asymmetry.; Neuro: Awake, alert, tangential historian. No facial droop. Speech clear. Moves all extremities spontaneously and to command without apparent gross focal motor deficits.; Skin: Color normal, Warm, Dry.   ED Treatments / Results  Labs (all labs ordered are listed, but only abnormal results are displayed)   EKG None  Radiology   Procedures Procedures (including critical care time)  Medications Ordered in ED Medications  iopamidol (ISOVUE-300) 61 % injection 100 mL (100 mLs Intravenous Contrast Given 09/30/18  1537)     Initial Impression / Assessment and Plan / ED Course  I have reviewed the triage vital signs and the nursing notes.  Pertinent labs & imaging results that were available during my care of the patient were reviewed by me and considered in my medical decision making (see chart for details).  MDM Reviewed: previous chart, nursing note and vitals Reviewed previous: labs Interpretation: labs, x-ray and CT scan   Results for orders placed or performed during the hospital encounter of 09/30/18  Lipase, blood  Result Value Ref Range   Lipase 33 11 - 51 U/L  Comprehensive metabolic panel  Result Value Ref Range   Sodium 132 (L) 135 - 145 mmol/L   Potassium 4.6 3.5 - 5.1 mmol/L   Chloride 95 (L) 98 - 111 mmol/L   CO2 29 22 - 32 mmol/L   Glucose, Bld 94  70 - 99 mg/dL   BUN 16 6 - 20 mg/dL   Creatinine, Ser 1.61 0.61 - 1.24 mg/dL   Calcium 9.7 8.9 - 09.6 mg/dL   Total Protein 7.8 6.5 - 8.1 g/dL   Albumin 3.9 3.5 - 5.0 g/dL   AST 25 15 - 41 U/L   ALT 18 0 - 44 U/L   Alkaline Phosphatase 36 (L) 38 - 126 U/L   Total Bilirubin 0.6 0.3 - 1.2 mg/dL   GFR calc non Af Amer >60 >60 mL/min   GFR calc Af Amer >60 >60 mL/min   Anion gap 8 5 - 15  CBC  Result Value Ref Range   WBC 3.2 (L) 4.0 - 10.5 K/uL   RBC 4.20 (L) 4.22 - 5.81 MIL/uL   Hemoglobin 13.1 13.0 - 17.0 g/dL   HCT 04.5 40.9 - 81.1 %   MCV 93.1 80.0 - 100.0 fL   MCH 31.2 26.0 - 34.0 pg   MCHC 33.5 30.0 - 36.0 g/dL   RDW 91.4 78.2 - 95.6 %   Platelets 139 (L) 150 - 400 K/uL   nRBC 0.0 0.0 - 0.2 %  Urinalysis, Routine w reflex microscopic  Result Value Ref Range   Color, Urine YELLOW YELLOW   APPearance CLEAR CLEAR   Specific Gravity, Urine 1.027 1.005 - 1.030   pH 5.0 5.0 - 8.0   Glucose, UA NEGATIVE NEGATIVE mg/dL   Hgb urine dipstick NEGATIVE NEGATIVE   Bilirubin Urine NEGATIVE NEGATIVE   Ketones, ur 5 (A) NEGATIVE mg/dL   Protein, ur NEGATIVE NEGATIVE mg/dL   Nitrite NEGATIVE NEGATIVE   Leukocytes, UA  NEGATIVE NEGATIVE   Dg Chest 2 View Result Date: 09/30/2018 CLINICAL DATA:  Abdominal pain, back and side pain EXAM: CHEST - 2 VIEW COMPARISON:  Chest x-ray of 03/29/2014 FINDINGS: No active infiltrate or effusion is seen. Mediastinal and hilar contours are unremarkable. The heart is within normal limits in size. There are degenerative changes in the mid to lower thoracic spine. IMPRESSION: No active cardiopulmonary disease. Electronically Signed   By: Dwyane Dee M.D.   On: 09/30/2018 16:07   Ct Abdomen Pelvis W Contrast Result Date: 09/30/2018 CLINICAL DATA:  Acute left-sided abdominal pain. EXAM: CT ABDOMEN AND PELVIS WITH CONTRAST TECHNIQUE: Multidetector CT imaging of the abdomen and pelvis was performed using the standard protocol following bolus administration of intravenous contrast. CONTRAST:  ISOVUE-300 IOPAMIDOL (ISOVUE-300) INJECTION 61% COMPARISON:  None. FINDINGS: Lower chest: No acute abnormality. Hepatobiliary: No focal liver abnormality is seen. No gallstones, gallbladder wall thickening, or biliary dilatation. Pancreas: Unremarkable. No pancreatic ductal dilatation or surrounding inflammatory changes. Spleen: Normal in size without focal abnormality. Adrenals/Urinary Tract: Adrenal glands are unremarkable. Kidneys are normal, without renal calculi, focal lesion, or hydronephrosis. Bladder is unremarkable. Stomach/Bowel: The stomach appears normal. There is no evidence of bowel obstruction or inflammation. Status post appendectomy. Vascular/Lymphatic: No significant vascular findings are present. No enlarged abdominal or pelvic lymph nodes. Reproductive: Prostate is unremarkable. Other: No abdominal wall hernia or abnormality. No abdominopelvic ascites. Musculoskeletal: No acute or significant osseous findings. IMPRESSION: No acute abnormality seen in the abdomen or pelvis. Electronically Signed   By: Lupita Raider, M.D.   On: 09/30/2018 16:05    1815:  Pt has gotten out of bed,  gotten himself dressed, and is sitting in a chair in his exam room; states he feels better and wants to go home now. Pt has tol PO well while in the ED without N/V.  No stooling while in the ED.  Abd benign, resps easy, VSS. Workup reassuring; f/u PMD. Dx and testing d/w pt.  Questions answered.  Verb understanding, agreeable to d/c back to group home with outpt f/u.    Final Clinical Impressions(s) / ED Diagnoses   Final diagnoses:  None    ED Discharge Orders    None       Samuel Jester, DO 10/02/18 2142

## 2018-09-30 NOTE — ED Notes (Signed)
Contacted [facility], states they will be here in 30 min to 1 hour to pick pt up.

## 2018-09-30 NOTE — Discharge Instructions (Addendum)
Take your usual prescriptions as previously directed.  Call your regular medical doctor tomorrow to schedule a follow up appointment within the next 3 days.  Return to the Emergency Department immediately sooner if worsening.  ° °

## 2018-09-30 NOTE — ED Notes (Signed)
Pt tolerated a granola bar and cola tolerated well.

## 2018-09-30 NOTE — ED Notes (Signed)
Pt given a sprite at this time. 

## 2018-09-30 NOTE — ED Triage Notes (Signed)
Pt is having abdominal pain, back pain, and side pain. From a group home in [place]. Pt in NAD. Does not have a history of kidney stones.
